# Patient Record
Sex: Female | Born: 1965 | Race: White | Hispanic: No | Marital: Married | Smoking: Never smoker
Health system: Southern US, Community
[De-identification: ages and names within clinical notes are randomized; demographics above are authoritative.]

## PROBLEM LIST (undated history)

## (undated) DIAGNOSIS — E785 Hyperlipidemia, unspecified: Secondary | ICD-10-CM

## (undated) DIAGNOSIS — J309 Allergic rhinitis, unspecified: Secondary | ICD-10-CM

## (undated) DIAGNOSIS — G4733 Obstructive sleep apnea (adult) (pediatric): Secondary | ICD-10-CM

## (undated) DIAGNOSIS — D649 Anemia, unspecified: Secondary | ICD-10-CM

## (undated) DIAGNOSIS — E669 Obesity, unspecified: Secondary | ICD-10-CM

## (undated) DIAGNOSIS — E559 Vitamin D deficiency, unspecified: Secondary | ICD-10-CM

## (undated) DIAGNOSIS — M1990 Unspecified osteoarthritis, unspecified site: Secondary | ICD-10-CM

## (undated) DIAGNOSIS — E539 Vitamin B deficiency, unspecified: Secondary | ICD-10-CM

## (undated) DIAGNOSIS — J45909 Unspecified asthma, uncomplicated: Secondary | ICD-10-CM

## (undated) DIAGNOSIS — R002 Palpitations: Secondary | ICD-10-CM

## (undated) HISTORY — DX: Vitamin B deficiency, unspecified: E53.9

## (undated) HISTORY — DX: Allergic rhinitis, unspecified: J30.9

## (undated) HISTORY — DX: Hyperlipidemia, unspecified: E78.5

## (undated) HISTORY — PX: OTHER SURGICAL HISTORY: SHX169

## (undated) HISTORY — DX: Unspecified osteoarthritis, unspecified site: M19.90

## (undated) HISTORY — DX: Palpitations: R00.2

## (undated) HISTORY — PX: TUBAL LIGATION: SHX77

## (undated) HISTORY — DX: Vitamin D deficiency, unspecified: E55.9

## (undated) HISTORY — DX: Anemia, unspecified: D64.9

## (undated) HISTORY — DX: Unspecified asthma, uncomplicated: J45.909

## (undated) HISTORY — DX: Obstructive sleep apnea (adult) (pediatric): G47.33

## (undated) HISTORY — DX: Obesity, unspecified: E66.9

---

## 2008-02-07 ENCOUNTER — Ambulatory Visit: Payer: Self-pay | Admitting: Internal Medicine

## 2008-02-21 ENCOUNTER — Ambulatory Visit: Payer: Self-pay | Admitting: Internal Medicine

## 2009-03-15 ENCOUNTER — Emergency Department: Payer: Self-pay | Admitting: Emergency Medicine

## 2009-09-23 ENCOUNTER — Ambulatory Visit: Payer: Self-pay | Admitting: Internal Medicine

## 2010-11-26 ENCOUNTER — Ambulatory Visit: Payer: Self-pay | Admitting: Internal Medicine

## 2011-10-17 ENCOUNTER — Ambulatory Visit: Payer: Self-pay | Admitting: Internal Medicine

## 2012-01-06 ENCOUNTER — Ambulatory Visit: Payer: Self-pay | Admitting: Internal Medicine

## 2012-02-21 ENCOUNTER — Ambulatory Visit: Payer: Self-pay | Admitting: Internal Medicine

## 2013-01-07 ENCOUNTER — Ambulatory Visit: Payer: Self-pay | Admitting: Internal Medicine

## 2014-01-08 ENCOUNTER — Ambulatory Visit: Payer: Self-pay | Admitting: Nurse Practitioner

## 2016-09-01 ENCOUNTER — Other Ambulatory Visit: Payer: Self-pay | Admitting: Nurse Practitioner

## 2016-09-01 DIAGNOSIS — Z1231 Encounter for screening mammogram for malignant neoplasm of breast: Secondary | ICD-10-CM

## 2016-09-26 ENCOUNTER — Ambulatory Visit
Admission: RE | Admit: 2016-09-26 | Discharge: 2016-09-26 | Disposition: A | Discharge status: Home / Self Care | Payer: 59 | Source: Ambulatory Visit | Attending: Nurse Practitioner | Admitting: Nurse Practitioner

## 2016-09-26 ENCOUNTER — Encounter: Payer: Self-pay | Admitting: Radiology

## 2016-09-26 DIAGNOSIS — Z1231 Encounter for screening mammogram for malignant neoplasm of breast: Secondary | ICD-10-CM | POA: Diagnosis not present

## 2017-06-08 ENCOUNTER — Other Ambulatory Visit: Payer: Self-pay | Admitting: Nurse Practitioner

## 2017-06-08 DIAGNOSIS — Z1231 Encounter for screening mammogram for malignant neoplasm of breast: Secondary | ICD-10-CM

## 2017-10-06 ENCOUNTER — Ambulatory Visit
Admission: RE | Admit: 2017-10-06 | Discharge: 2017-10-06 | Disposition: A | Discharge status: Home / Self Care | Payer: Managed Care, Other (non HMO) | Source: Ambulatory Visit | Attending: Nurse Practitioner | Admitting: Nurse Practitioner

## 2017-10-06 DIAGNOSIS — Z1231 Encounter for screening mammogram for malignant neoplasm of breast: Secondary | ICD-10-CM | POA: Diagnosis not present

## 2018-06-15 ENCOUNTER — Other Ambulatory Visit: Payer: Self-pay | Admitting: Nurse Practitioner

## 2018-06-15 DIAGNOSIS — Z1231 Encounter for screening mammogram for malignant neoplasm of breast: Secondary | ICD-10-CM

## 2018-10-15 ENCOUNTER — Inpatient Hospital Stay: Admission: RE | Admit: 2018-10-15 | Payer: Managed Care, Other (non HMO) | Source: Ambulatory Visit

## 2019-01-28 ENCOUNTER — Ambulatory Visit
Admission: RE | Admit: 2019-01-28 | Discharge: 2019-01-28 | Disposition: A | Discharge status: Home / Self Care | Payer: 59 | Source: Ambulatory Visit | Attending: Nurse Practitioner | Admitting: Nurse Practitioner

## 2019-01-28 DIAGNOSIS — Z1231 Encounter for screening mammogram for malignant neoplasm of breast: Secondary | ICD-10-CM | POA: Insufficient documentation

## 2019-07-05 ENCOUNTER — Other Ambulatory Visit: Payer: Self-pay

## 2019-07-05 ENCOUNTER — Ambulatory Visit: Payer: 59 | Attending: Internal Medicine

## 2019-07-05 DIAGNOSIS — Z23 Encounter for immunization: Secondary | ICD-10-CM

## 2019-07-05 NOTE — Progress Notes (Signed)
   Covid-19 Vaccination Clinic  Name:  Connie Howe    MRN: [id number] DOB: 1965-10-07  07/05/2019  [name] was observed post Covid-19 immunization for 15 minutes without incident. She was provided with Vaccine Information Sheet and instruction to access the V-Safe system.   [name] was instructed to call 911 with any severe reactions post vaccine: Marland Kitchen Difficulty breathing  . Swelling of face and throat  . A fast heartbeat  . A bad rash all over body  . Dizziness and weakness   Immunizations Administered    Name Date Dose VIS Date Route   Pfizer COVID-19 Vaccine 07/05/2019  9:03 AM 0.3 mL 03/29/2019 Intramuscular   Manufacturer: [employer], [employer]   Lot: [id number]   NDC: [address]-8

## 2019-07-30 ENCOUNTER — Ambulatory Visit: Payer: Managed Care, Other (non HMO) | Attending: Internal Medicine

## 2019-07-30 DIAGNOSIS — Z23 Encounter for immunization: Secondary | ICD-10-CM

## 2019-07-30 NOTE — Progress Notes (Signed)
   Covid-19 Vaccination Clinic  Name:  Connie Howe    MRN: [id number] DOB: June 23, 1965  07/30/2019  [name] was observed post Covid-19 immunization for 15 minutes without incident. She was provided with Vaccine Information Sheet and instruction to access the V-Safe system.   [name] was instructed to call 911 with any severe reactions post vaccine: Marland Kitchen Difficulty breathing  . Swelling of face and throat  . A fast heartbeat  . A bad rash all over body  . Dizziness and weakness   Immunizations Administered    Name Date Dose VIS Date Route   Pfizer COVID-19 Vaccine 07/30/2019 10:15 AM 0.3 mL 03/29/2019 Intramuscular   Manufacturer: [employer], [employer]   Lot: [id number]   NDC: [address]-0

## 2020-07-15 ENCOUNTER — Other Ambulatory Visit: Payer: Self-pay | Admitting: Nurse Practitioner

## 2020-07-15 DIAGNOSIS — Z1231 Encounter for screening mammogram for malignant neoplasm of breast: Secondary | ICD-10-CM

## 2020-08-04 ENCOUNTER — Ambulatory Visit
Admission: RE | Admit: 2020-08-04 | Discharge: 2020-08-04 | Disposition: A | Discharge status: Home / Self Care | Payer: 59 | Source: Ambulatory Visit | Attending: Nurse Practitioner | Admitting: Nurse Practitioner

## 2020-08-04 ENCOUNTER — Other Ambulatory Visit: Payer: Self-pay

## 2020-08-04 DIAGNOSIS — Z1231 Encounter for screening mammogram for malignant neoplasm of breast: Secondary | ICD-10-CM | POA: Diagnosis present

## 2022-06-13 ENCOUNTER — Encounter: Payer: Self-pay | Admitting: Gastroenterology

## 2022-06-20 ENCOUNTER — Encounter: Payer: Self-pay | Admitting: Nurse Practitioner

## 2022-06-20 DIAGNOSIS — A0472 Enterocolitis due to Clostridium difficile, not specified as recurrent: Secondary | ICD-10-CM

## 2022-06-21 MED ORDER — VANCOMYCIN HCL 250 MG PO CAPS: 250.0000 mg | ORAL_CAPSULE | Freq: Four times a day (QID) | ORAL | 0 refills | Status: DC

## 2022-06-24 ENCOUNTER — Other Ambulatory Visit: Payer: Self-pay | Admitting: Nurse Practitioner

## 2022-07-01 ENCOUNTER — Encounter: Payer: Self-pay | Admitting: Gastroenterology

## 2022-07-01 ENCOUNTER — Ambulatory Visit: Payer: 59 | Admitting: Gastroenterology

## 2022-07-01 ENCOUNTER — Telehealth: Payer: Self-pay

## 2022-07-01 VITALS — BP 120/88 | HR 65 | Ht 65.0 in | Wt 214.0 lb

## 2022-07-01 DIAGNOSIS — R194 Change in bowel habit: Secondary | ICD-10-CM | POA: Diagnosis not present

## 2022-07-01 MED ORDER — NA SULFATE-K SULFATE-MG SULF 17.5-3.13-1.6 GM/177ML PO SOLN: 1.0000 | ORAL | 0 refills | Status: DC

## 2022-07-01 MED ORDER — RIFAXIMIN 550 MG PO TABS: 550.0000 mg | ORAL_TABLET | Freq: Three times a day (TID) | ORAL | 0 refills | Status: DC

## 2022-07-01 NOTE — Telephone Encounter (Signed)
PA request received via CMM for Xifaxan 550MG  tablets  PA submitted to [facility] and is pending determination  Key: [id number]

## 2022-07-01 NOTE — Patient Instructions (Signed)
It was my pleasure to provide care to you today. Based on our discussion, I am providing you with my recommendations below:  RECOMMENDATION(S): You have been scheduled for a colonoscopy. Please follow written instructions given to you at your visit today.  Please pick up your prep supplies at the pharmacy within the next 1-3 days. If you use inhalers (even only as needed), please bring them with you on the day of your procedure.  We have sent the following medications to your pharmacy for you to pick up at your convenience: [place] [facility]:  After your procedure, you will receive a call from my office staff regarding my recommendation for follow up.  BMI:  _______________________________________________________  If your blood pressure at your visit was 140/90 or greater, please contact your primary care physician to follow up on this.  _______________________________________________________  If you are age 42 or older, your body mass index should be between 23-30. Your Body mass index is 35.61 kg/m. If this is out of the aforementioned range listed, please consider follow up with your Primary Care Provider.  If you are age 32 or younger, your body mass index should be between 19-25. Your Body mass index is 35.61 kg/m. If this is out of the aformentioned range listed, please consider follow up with your Primary Care Provider.    MY CHART:  The [place] [facility] providers would like to encourage you to use [facility] to communicate with providers for non-urgent requests or questions.  Due to long hold times on the telephone, sending your provider a message by [facility] may be a faster and more efficient way to get a response.  Please allow 48 business hours for a response.  Please remember that this is for non-urgent requests.   Thank you for trusting me with your gastrointestinal care!    Thornton Park, MD, MPH

## 2022-07-01 NOTE — Progress Notes (Signed)
Referring Provider: Evern Bio, NP Primary Care Physician:  Evern Bio, NP  Reason for Consultation:  Change in bowel habits   IMPRESSION:  Change in bowel habits - Symptoms started after C diff now with altered stool and mucous in the stool.  ?post-infectious IBS, must exclude organic disease such as IBD  PLAN: Colonoscopy with biopsies Empiric trial of Xifaxan 550 mg TID x 14 days Additional stools studies if colonoscopy is non-diagnostic   HPI: Connie Howe is a 57 y.o. female referred by NP [facility] for further evaluation of abdominal bloating, nausea, and mucus/bloody diarrhea.  The history is obtained through the patient, review of paper records provided by NP Charlynn Grimes, and review of her electronic health record.  She has a history of allergic rhinitis, asthma, hyperlipidemia, obstructive sleep apnea, obesity, palpitations controlled with low-dose metoprolol, vitamin D deficiency, vitamin D deficiency, and social phobia. She is a Librarian, academic.  She has had several weeks of abdominal bloating, nausea, and bloody diarrhea not responding to Imodium after having C diff in November 2023. Antibiotics for ear infection and pleurisy preceded the C Diff. She completed 14 days of oral vancomycin and Florastor. She has continued on the Florastor due to intermittent diarrhea and mushy, poorly formed stools.  She recently had an accident where she did not make it to the bathroom with all mucous output.   Normal bowel habits are one formed stool daily. No extra-GI manifestations of IBD except for chronic dry eyes.   The patient was adopted.  No known family history of colon cancer, colon polyps, or gastrointestinal disease.  No recent abdominal imaging.  Cologuard over 3 years ago. No prior endoscopic evaluation.  Labs 07/15/2020 include a normal CBC and normal CMP.  At that time TSH and hemoglobin A1c were also normal.   Past Medical History:  Diagnosis Date    Allergic rhinitis    Anemia    Asthma    Hyperlipidemia    Obesity    Palpitations    Sleep apnea, obstructive    Vitamin B deficiency    Vitamin D deficiency     Past Surgical History:  Procedure Laterality Date   TUBAL LIGATION     uterine ablation      Current Outpatient Medications  Medication Sig Dispense Refill   albuterol (VENTOLIN HFA) 108 (90 Base) MCG/ACT inhaler Inhale 2 puffs into the lungs every 4 (four) hours as needed for shortness of breath.     budesonide-formoterol (SYMBICORT) 160-4.5 MCG/ACT inhaler Inhale 2 puffs into the lungs 2 (two) times daily.     escitalopram (LEXAPRO) 5 MG tablet Take 5 mg by mouth daily.     hydrOXYzine (ATARAX) 10 MG tablet Take 10 mg by mouth daily.     metoprolol succinate (TOPROL-XL) 50 MG 24 hr tablet Take 100 mg by mouth daily.     montelukast (SINGULAIR) 10 MG tablet TAKE 1 TABLET BY MOUTH IN THE  MORNING 90 tablet 3   Na Sulfate-K Sulfate-Mg Sulf (SUPREP BOWEL PREP KIT) 17.5-3.13-1.6 GM/177ML SOLN Take 1 kit by mouth as directed. For colonoscopy prep 354 mL 0   rifaximin (XIFAXAN) 550 MG TABS tablet Take 1 tablet (550 mg total) by mouth 3 (three) times daily. 42 tablet 0   rosuvastatin (CRESTOR) 5 MG tablet Take 5 mg by mouth daily.     No current facility-administered medications for this visit.    Allergies as of 07/01/2022 - Review Complete 07/01/2022  Allergen Reaction Noted  Dog epithelium Dermatitis, Itching, Rash, Shortness Of Breath, and Swelling 06/27/2022    Family History  Problem Relation Age of Onset   Breast cancer Cousin        maternal    Social History   Socioeconomic History   Marital status: Married    Spouse name: Not on file   Number of children: Not on file   Years of education: Not on file   Highest education level: Not on file  Occupational History   Not on file  Tobacco Use   Smoking status: Not on file   Smokeless tobacco: Not on file  Substance and Sexual Activity   Alcohol use:  Not on file   Drug use: Not on file   Sexual activity: Not on file  Other Topics Concern   Not on file  Social History Narrative   Not on file   Social Determinants of Health   Financial Resource Strain: Not on file  Food Insecurity: Not on file  Transportation Needs: Not on file  Physical Activity: Not on file  Stress: Not on file  Social Connections: Not on file  Intimate Partner Violence: Not on file    Review of Systems: 12 system ROS is negative except as noted above except for intermittent ankle edema.   Physical Exam: General:   Alert,  well-nourished, pleasant and cooperative in NAD Head:  Normocephalic and atraumatic. Eyes:  Sclera clear, no icterus.   Conjunctiva pink. Ears:  Normal auditory acuity. Nose:  No deformity, discharge,  or lesions. Mouth:  No deformity or lesions.   Neck:  Supple; no masses or thyromegaly. Lungs:  Clear throughout to auscultation.   No wheezes. Heart:  Regular rate and rhythm; no murmurs. Abdomen:  Soft, central obesity, nontender, nondistended, normal bowel sounds, no rebound or guarding. No hepatosplenomegaly.   Rectal:  Deferred  Msk:  Symmetrical. No boney deformities LAD: No inguinal or umbilical LAD Extremities:  No clubbing or edema. Neurologic:  Alert and  oriented x4;  grossly nonfocal Skin:  Intact without significant lesions or rashes. Psych:  Alert and cooperative. Normal mood and affect.    Tariya Morrissette L. Tarri Glenn, MD, MPH 07/01/2022, 12:13 PM

## 2022-07-04 ENCOUNTER — Encounter: Payer: Self-pay | Admitting: Gastroenterology

## 2022-07-04 NOTE — Telephone Encounter (Signed)
Appeal submitted via [place]. Pending determination  Key: [id number]

## 2022-07-07 NOTE — Telephone Encounter (Signed)
Patient Advocate Encounter  Prior Authorization for Connie Howe has been approved.    PA# Appeal Case [id number] Effective dates: 3.15.24 through 4.2.24   However, fax received requesting additional information. Filled out form and faxed to number provided [phone]

## 2022-07-18 NOTE — Telephone Encounter (Signed)
Appeal has been APPROVED. Medication is now covered from 07/01/2022-07/19/2022

## 2022-07-26 ENCOUNTER — Encounter: Payer: Self-pay | Admitting: Gastroenterology

## 2022-08-04 ENCOUNTER — Ambulatory Visit (AMBULATORY_SURGERY_CENTER): Payer: 59 | Admitting: Gastroenterology

## 2022-08-04 ENCOUNTER — Encounter: Payer: Self-pay | Admitting: Gastroenterology

## 2022-08-04 VITALS — BP 112/62 | HR 61 | Temp 97.8°F | Resp 16 | Ht 65.0 in | Wt 214.0 lb

## 2022-08-04 DIAGNOSIS — R194 Change in bowel habit: Secondary | ICD-10-CM

## 2022-08-04 DIAGNOSIS — D123 Benign neoplasm of transverse colon: Secondary | ICD-10-CM | POA: Diagnosis present

## 2022-08-04 DIAGNOSIS — K635 Polyp of colon: Secondary | ICD-10-CM | POA: Diagnosis not present

## 2022-08-04 MED ORDER — SODIUM CHLORIDE 0.9 % IV SOLN: 500.0000 mL | Freq: Once | INTRAVENOUS | Status: DC

## 2022-08-04 NOTE — Progress Notes (Signed)
Called to room to assist during endoscopic procedure.  Patient ID and intended procedure confirmed with present staff. Received instructions for my participation in the procedure from the performing physician.  

## 2022-08-04 NOTE — Progress Notes (Signed)
Uneventful anesthetic. Report to pacu rn. Vss. Care resumed by rn. 

## 2022-08-04 NOTE — Op Note (Signed)
[facility] Patient Name: Connie Howe Procedure Date: 08/04/2022 11:53 AM MRN: [id number] Endoscopist: Tressia Danas MD, MD, [id number] Age: 57 Referring MD:  Date of Birth: June 30, 1965 Gender: Female Account #: [id number] Procedure:                Colonoscopy Indications:              Change in bowel habits Medicines:                Monitored Anesthesia Care Procedure:                Pre-Anesthesia Assessment:                           - Prior to the procedure, a History and Physical                            was performed, and patient medications and                            allergies were reviewed. The patient's tolerance of                            previous anesthesia was also reviewed. The risks                            and benefits of the procedure and the sedation                            options and risks were discussed with the patient.                            All questions were answered, and informed consent                            was obtained. Prior Anticoagulants: The patient has                            taken no anticoagulant or antiplatelet agents. ASA                            Grade Assessment: II - A patient with mild systemic                            disease. After reviewing the risks and benefits,                            the patient was deemed in satisfactory condition to                            undergo the procedure.                           After obtaining informed consent, the colonoscope  was passed under direct vision. Throughout the                            procedure, the patient's blood pressure, pulse, and                            oxygen saturations were monitored continuously. The                            CF [id number] #[id number] was introduced through the anus                            and advanced to the 3 cm into the ileum. The                            colonoscopy was performed  without difficulty. The                            patient tolerated the procedure well. The quality                            of the bowel preparation was good. The terminal                            ileum, ileocecal valve, appendiceal orifice, and                            rectum were photographed. Scope In: 11:57:07 AM Scope Out: 12:10:22 PM Scope Withdrawal Time: 0 hours 9 minutes 14 seconds  Total Procedure Duration: 0 hours 13 minutes 15 seconds  Findings:                 The perianal and digital rectal examinations were                            normal.                           A 5 mm polyp was found in the transverse colon. The                            polyp was flat. The polyp was removed with a cold                            snare. Resection and retrieval were complete.                            Estimated blood loss was minimal.                           A few medium-mouthed and small-mouthed diverticula                            were found in the sigmoid colon and descending  colon.                           The colon was otherwise normal. Biopsies were taken                            from the right and left colon with a cold forceps                            for histology. Estimated blood loss was minimal.                           The exam was otherwise without abnormality on                            direct and retroflexion views. Complications:            No immediate complications. Estimated Blood Loss:     Estimated blood loss was minimal. Impression:               - One 5 mm polyp in the transverse colon, removed                            with a cold snare. Resected and retrieved.                           - The entire examined colon is normal. Biopsied.                           - The examination was otherwise normal on direct                            and retroflexion views. Recommendation:           - Patient has a contact  number available for                            emergencies. The signs and symptoms of potential                            delayed complications were discussed with the                            patient. Return to normal activities tomorrow.                            Written discharge instructions were provided to the                            patient.                           - Resume previous diet.                           - Continue present medications.                           -  Await pathology results.                           - Repeat colonoscopy date to be determined after                            pending pathology results are reviewed for                            surveillance.                           - Emerging evidence supports eating a diet of                            fruits, vegetables, grains, calcium, and yogurt                            while reducing red meat and alcohol may reduce the                            risk of colon cancer.                           - Office follow-up. Tressia Danas MD, MD 08/04/2022 12:16:03 PM This report has been signed electronically.

## 2022-08-04 NOTE — Patient Instructions (Addendum)
Continue present medications. Await pathology results. Repeat colonoscopy date to be determined after pending pathology results are reviewed for surveillance. Emerging evidence supports eating a diet of fruits, vegetables, grains, calcium, and yogurt while reducing red meat and alcohol may reduce the  risk of colon cancer.                                                   Office follow-up.   YOU HAD AN ENDOSCOPIC PROCEDURE TODAY AT THE [facility]:   Refer to the procedure report that was given to you for any specific questions about what was found during the examination.  If the procedure report does not answer your questions, please call your gastroenterologist to clarify.  If you requested that your care partner not be given the details of your procedure findings, then the procedure report has been included in a sealed envelope for you to review at your convenience later.  YOU SHOULD EXPECT: Some feelings of bloating in the abdomen. Passage of more gas than usual.  Walking can help get rid of the air that was put into your GI tract during the procedure and reduce the bloating. If you had a lower endoscopy (such as a colonoscopy or flexible sigmoidoscopy) you may notice spotting of blood in your stool or on the toilet paper. If you underwent a bowel prep for your procedure, you may not have a normal bowel movement for a few days.  Please Note:  You might notice some irritation and congestion in your nose or some drainage.  This is from the oxygen used during your procedure.  There is no need for concern and it should clear up in a day or so.  SYMPTOMS TO REPORT IMMEDIATELY:  Following lower endoscopy (colonoscopy or flexible sigmoidoscopy):  Excessive amounts of blood in the stool  Significant tenderness or worsening of abdominal pains  Swelling of the abdomen that is new, acute  Fever of 100F or higher  For urgent or emergent issues, a gastroenterologist can be reached at any  hour by calling [phone]. Do not use MyChart messaging for urgent concerns.    DIET:  We do recommend a small meal at first, but then you may proceed to your regular diet.  Drink plenty of fluids but you should avoid alcoholic beverages for 24 hours.  ACTIVITY:  You should plan to take it easy for the rest of today and you should NOT DRIVE or use heavy machinery until tomorrow (because of the sedation medicines used during the test).    FOLLOW UP: Our staff will call the number listed on your records the next business day following your procedure.  We will call around 7:15- 8:00 am to check on you and address any questions or concerns that you may have regarding the information given to you following your procedure. If we do not reach you, we will leave a message.     If any biopsies were taken you will be contacted by phone or by letter within the next 1-3 weeks.  Please call us at [phone] if you have not heard about the biopsies in 3 weeks.    SIGNATURES/CONFIDENTIALITY: You and/or your care partner have signed paperwork which will be entered into your electronic medical record.  These signatures attest to the fact that that the information above on  your After Visit Summary has been reviewed and is understood.  Full responsibility of the confidentiality of this discharge information lies with you and/or your care-partner.

## 2022-08-04 NOTE — Progress Notes (Signed)
Referring Provider: Orson Eva, NP Primary Care Physician:  Orson Eva, NP  Indication for Colonoscopy:  Change in bowel habits   IMPRESSION:  Change in bowel habits Appropriate candidate for monitored anesthesia care  PLAN: Colonoscopy in the [facility] today   HPI: Connie Howe is a 57 y.o. female presents for screening colonoscopy.  She has had several weeks of abdominal bloating, nausea, and bloody diarrhea not responding to Imodium after having C diff in November 2023. Antibiotics for ear infection and pleurisy preceded the C Diff. She completed 14 days of oral vancomycin and Florastor. She has continued on the Florastor due to intermittent diarrhea and mushy, poorly formed stools.  She recently had an accident where she did not make it to the bathroom with all mucous output.    Normal bowel habits are one formed stool daily. No extra-GI manifestations of IBD except for chronic dry eyes.    The patient was adopted.  No known family history of colon cancer, colon polyps, or gastrointestinal disease.   No recent abdominal imaging.   Cologuard over 3 years ago. No prior endoscopic evaluation.   Labs 07/15/2020 include a normal CBC and normal CMP.  At that time TSH and hemoglobin A1c were also normal.   Past Medical History:  Diagnosis Date   Allergic rhinitis    Anemia    Asthma    Hyperlipidemia    Obesity    Palpitations    Sleep apnea, obstructive    Vitamin B deficiency    Vitamin D deficiency     Past Surgical History:  Procedure Laterality Date   TUBAL LIGATION     uterine ablation      Current Outpatient Medications  Medication Sig Dispense Refill   budesonide-formoterol (SYMBICORT) 160-4.5 MCG/ACT inhaler Inhale 2 puffs into the lungs 2 (two) times daily.     Cholecalciferol (VITAMIN D3) 1.25 MG (50000 UT) CAPS      escitalopram (LEXAPRO) 5 MG tablet Take 5 mg by mouth daily.     metoprolol succinate (TOPROL-XL) 50 MG 24 hr tablet Take  100 mg by mouth daily.     montelukast (SINGULAIR) 10 MG tablet TAKE 1 TABLET BY MOUTH IN THE  MORNING 90 tablet 3   OVER THE COUNTER MEDICATION Take 1 tablet by mouth daily.     rosuvastatin (CRESTOR) 5 MG tablet Take 5 mg by mouth daily.     albuterol (VENTOLIN HFA) 108 (90 Base) MCG/ACT inhaler Inhale 2 puffs into the lungs every 4 (four) hours as needed for shortness of breath.     hydrOXYzine (ATARAX) 10 MG tablet Take 10 mg by mouth daily. (Patient not taking: Reported on 08/04/2022)     rifaximin (XIFAXAN) 550 MG TABS tablet Take 1 tablet (550 mg total) by mouth 3 (three) times daily. (Patient not taking: Reported on 08/04/2022) 42 tablet 0   Current Facility-Administered Medications  Medication Dose Route Frequency Provider Last Rate Last Admin   0.9 %  sodium chloride infusion  500 mL Intravenous Once Tressia Danas, MD        Allergies as of 08/04/2022 - Review Complete 08/04/2022  Allergen Reaction Noted   Dog epithelium Dermatitis, Itching, Rash, Shortness Of Breath, and Swelling 06/27/2022   Other Shortness Of Breath, Itching, and Rash 08/04/2022   Grass pollen(k-o-r-t-swt vern) Itching and Rash 08/04/2022    Family History  Problem Relation Age of Onset   Breast cancer Cousin        maternal  Physical Exam: General:   Alert,  well-nourished, pleasant and cooperative in NAD Head:  Normocephalic and atraumatic. Eyes:  Sclera clear, no icterus.   Conjunctiva pink. Mouth:  No deformity or lesions.   Neck:  Supple; no masses or thyromegaly. Lungs:  Clear throughout to auscultation.   No wheezes. Heart:  Regular rate and rhythm; no murmurs. Abdomen:  Soft, non-tender, nondistended, normal bowel sounds, no rebound or guarding.  Msk:  Symmetrical. No boney deformities LAD: No inguinal or umbilical LAD Extremities:  No clubbing or edema. Neurologic:  Alert and  oriented x4;  grossly nonfocal Skin:  No obvious rash or bruise. Psych:  Alert and cooperative. Normal mood  and affect.     Studies/Results: No results found.    Ernestyne Caldwell L. Orvan Falconer, MD, MPH 08/04/2022, 11:47 AM

## 2022-08-05 ENCOUNTER — Telehealth: Payer: Self-pay | Admitting: *Deleted

## 2022-08-05 NOTE — Telephone Encounter (Signed)
-----   Message from Tressia Danas, MD sent at 08/04/2022 12:12 PM EDT ----- Office follow-up with an APP.  Thanks.  KLB

## 2022-08-05 NOTE — Telephone Encounter (Signed)
Appointment scheduled with Dr. Barron Alvine on 10/25/22 @ 9 am.

## 2022-08-05 NOTE — Telephone Encounter (Signed)
Attempted f/u phone call. No answer. Left message. °

## 2022-08-09 ENCOUNTER — Encounter: Payer: Self-pay | Admitting: Gastroenterology

## 2022-08-14 ENCOUNTER — Other Ambulatory Visit: Payer: Self-pay | Admitting: Nurse Practitioner

## 2022-09-19 ENCOUNTER — Ambulatory Visit: Payer: 59 | Admitting: Nurse Practitioner

## 2022-09-22 ENCOUNTER — Ambulatory Visit: Payer: 59 | Admitting: Internal Medicine

## 2022-09-22 ENCOUNTER — Encounter: Payer: Self-pay | Admitting: Internal Medicine

## 2022-09-22 VITALS — BP 118/74 | HR 68 | Ht 65.0 in | Wt 220.0 lb

## 2022-09-22 DIAGNOSIS — R7303 Prediabetes: Secondary | ICD-10-CM

## 2022-09-22 DIAGNOSIS — E559 Vitamin D deficiency, unspecified: Secondary | ICD-10-CM

## 2022-09-22 DIAGNOSIS — E538 Deficiency of other specified B group vitamins: Secondary | ICD-10-CM

## 2022-09-22 DIAGNOSIS — R600 Localized edema: Secondary | ICD-10-CM | POA: Diagnosis not present

## 2022-09-22 DIAGNOSIS — R5383 Other fatigue: Secondary | ICD-10-CM | POA: Diagnosis not present

## 2022-09-22 DIAGNOSIS — E782 Mixed hyperlipidemia: Secondary | ICD-10-CM

## 2022-09-22 MED ORDER — FUROSEMIDE 20 MG PO TABS
20.0000 mg | ORAL_TABLET | Freq: Every day | ORAL | 0 refills | Status: DC
Start: 2022-09-22 — End: 2022-10-14

## 2022-09-22 NOTE — Progress Notes (Signed)
Established Patient Office Visit  Subjective:  Patient ID: Connie Howe, female    DOB: 12-Mar-1966  Age: 57 y.o. MRN: [id number]  Chief Complaint  Patient presents with   Follow-up    Headaches/edema in ankles    Patient comes in today with reports of headaches, nausea and now has bilateral leg swelling.  Patient recently had several GI problems including C. difficile infection.  She has been having change in her bowel habits and is being treated with different medications and probiotics.   Patient reports she started having headaches last week and she felt dehydrated and salt depleted so decided to increase her salt intake as she was already drinking plenty of fluids.  Today her headaches have resolved but she can now has bilateral leg swelling..  She does not have any shortness of breath, no chest pain, no palpitations.  She does feel very tired.    No other concerns at this time.   Past Medical History:  Diagnosis Date   Allergic rhinitis    Anemia    Asthma    Hyperlipidemia    Obesity    Palpitations    Sleep apnea, obstructive    Vitamin B deficiency    Vitamin D deficiency     Past Surgical History:  Procedure Laterality Date   TUBAL LIGATION     uterine ablation      Social History   Socioeconomic History   Marital status: Married    Spouse name: Not on file   Number of children: Not on file   Years of education: Not on file   Highest education level: Not on file  Occupational History   Not on file  Tobacco Use   Smoking status: Not on file    Passive exposure: Past   Smokeless tobacco: Not on file  Substance and Sexual Activity   Alcohol use: Never   Drug use: Never   Sexual activity: Not on file  Other Topics Concern   Not on file  Social History Narrative   Not on file   Social Determinants of Health   Financial Resource Strain: Not on file  Food Insecurity: Not on file  Transportation Needs: Not on file  Physical Activity: Not  on file  Stress: Not on file  Social Connections: Not on file  Intimate Partner Violence: Not on file    Family History  Problem Relation Age of Onset   Breast cancer Cousin        maternal    Allergies  Allergen Reactions   Dog Epithelium Dermatitis, Itching, Rash, Shortness Of Breath and Swelling   Other Shortness Of Breath, Itching and Rash    Cat epithelium,, trees, weed pollen   Grass Pollen(K-O-R-T-Swt Vern) Itching and Rash    Review of Systems  Constitutional:  Positive for malaise/fatigue. Negative for chills, diaphoresis, fever and weight loss.  HENT: Negative.    Eyes: Negative.   Respiratory:  Negative for cough, hemoptysis, sputum production, shortness of breath and wheezing.   Cardiovascular:  Negative for chest pain, palpitations, leg swelling and PND.  Gastrointestinal:  Positive for nausea. Negative for abdominal pain, blood in stool, diarrhea, heartburn and melena.  Genitourinary:  Negative for dysuria, flank pain, frequency, hematuria and urgency.  Musculoskeletal:  Negative for back pain, falls, joint pain, myalgias and neck pain.  Skin: Negative.   Neurological:  Positive for dizziness and headaches. Negative for tremors, sensory change, speech change, focal weakness, seizures, loss of consciousness and weakness.  Psychiatric/Behavioral:  Negative for depression. The patient is not nervous/anxious and does not have insomnia.        Objective:   BP 118/74   Pulse 68   Ht 5\' 5"  (1.651 m)   Wt 220 lb (99.8 kg)   SpO2 96%   BMI 36.61 kg/m   Vitals:   09/22/22 1118  BP: 118/74  Pulse: 68  Height: 5\' 5"  (1.651 m)  Weight: 220 lb (99.8 kg)  SpO2: 96%  BMI (Calculated): 36.61    Physical Exam Vitals and nursing note reviewed.  Constitutional:      Appearance: Normal appearance.  Cardiovascular:     Rate and Rhythm: Normal rate and regular rhythm.     Pulses: Normal pulses.     Heart sounds: Normal heart sounds.  Pulmonary:     Effort:  Pulmonary effort is normal.     Breath sounds: Normal breath sounds.  Abdominal:     General: Bowel sounds are normal. There is no distension.     Palpations: Abdomen is soft. There is no mass.     Tenderness: There is no right CVA tenderness or left CVA tenderness.  Musculoskeletal:        General: No tenderness.     Cervical back: Normal range of motion and neck supple.     Right lower leg: Edema present.     Left lower leg: Edema present.  Skin:    General: Skin is warm and dry.  Neurological:     General: No focal deficit present.     Mental Status: She is alert and oriented to person, place, and time.  Psychiatric:        Mood and Affect: Mood normal.        Behavior: Behavior normal.      No results found for any visits on 09/22/22.  No results found for this or any previous visit (from the past 2160 hour(s)).    Assessment & Plan:  Patient to get blood work done today.  Advised to sit with her legs elevated. Prescription sent for furosemide 20 mg that she is supposed to take only on Monday Wednesday and Friday. Will discuss her lab results once available. Problem List Items Addressed This Visit     Lower leg edema - Primary   Relevant Medications   furosemide (LASIX) 20 MG tablet   Other Relevant Orders   CMP14+EGFR   Vitamin D deficiency   Relevant Orders   Vitamin D (25 hydroxy)   Vitamin B12 deficiency   Relevant Orders   Vitamin B12   Other fatigue   Relevant Orders   CBC With Differential   TSH+T4F+T3Free   Prediabetes   Relevant Orders   Hemoglobin A1c   Mixed hyperlipidemia   Relevant Medications   furosemide (LASIX) 20 MG tablet   Other Relevant Orders   CMP14+EGFR   Lipid Panel w/o Chol/HDL Ratio    Return in about 2 weeks (around 10/06/2022).   Total time spent: 30 minutes  Margaretann Loveless, MD  09/22/2022   This document may have been prepared by [facility] Voice Recognition software and as such may include unintentional dictation errors.

## 2022-09-23 LAB — HEMOGLOBIN A1C
Est. average glucose Bld gHb Est-mCnc: 111 mg/dL
Hgb A1c MFr Bld: 5.5 % (ref 4.8–5.6)

## 2022-09-23 LAB — CBC WITH DIFFERENTIAL
Basophils Absolute: 0 10*3/uL (ref 0.0–0.2)
Basos: 1 %
EOS (ABSOLUTE): 0.1 10*3/uL (ref 0.0–0.4)
Eos: 2 %
Hematocrit: 41.7 % (ref 34.0–46.6)
Hemoglobin: 13.8 g/dL (ref 11.1–15.9)
Immature Grans (Abs): 0 10*3/uL (ref 0.0–0.1)
Immature Granulocytes: 1 %
Lymphocytes Absolute: 1.8 10*3/uL (ref 0.7–3.1)
Lymphs: 28 %
MCH: 30.6 pg (ref 26.6–33.0)
MCHC: 33.1 g/dL (ref 31.5–35.7)
MCV: 93 fL (ref 79–97)
Monocytes Absolute: 0.5 10*3/uL (ref 0.1–0.9)
Monocytes: 7 %
Neutrophils Absolute: 4 10*3/uL (ref 1.4–7.0)
Neutrophils: 61 %
RBC: 4.51 x10E6/uL (ref 3.77–5.28)
RDW: 13.4 % (ref 11.7–15.4)
WBC: 6.4 10*3/uL (ref 3.4–10.8)

## 2022-09-23 LAB — CMP14+EGFR
ALT: 16 IU/L (ref 0–32)
AST: 17 IU/L (ref 0–40)
Albumin/Globulin Ratio: 1.6 (ref 1.2–2.2)
Albumin: 4.1 g/dL (ref 3.8–4.9)
Alkaline Phosphatase: 74 IU/L (ref 44–121)
BUN/Creatinine Ratio: 15 (ref 9–23)
BUN: 12 mg/dL (ref 6–24)
Bilirubin Total: 0.3 mg/dL (ref 0.0–1.2)
CO2: 24 mmol/L (ref 20–29)
Calcium: 9.5 mg/dL (ref 8.7–10.2)
Chloride: 103 mmol/L (ref 96–106)
Creatinine, Ser: 0.81 mg/dL (ref 0.57–1.00)
Globulin, Total: 2.6 g/dL (ref 1.5–4.5)
Glucose: 79 mg/dL (ref 70–99)
Potassium: 4.5 mmol/L (ref 3.5–5.2)
Sodium: 141 mmol/L (ref 134–144)
Total Protein: 6.7 g/dL (ref 6.0–8.5)
eGFR: 86 mL/min/{1.73_m2} (ref >59)

## 2022-09-23 LAB — LIPID PANEL W/O CHOL/HDL RATIO
Cholesterol, Total: 176 mg/dL (ref 100–199)
HDL: 65 mg/dL (ref >39)
LDL Chol Calc (NIH): 91 mg/dL (ref 0–99)
Triglycerides: 111 mg/dL (ref 0–149)
VLDL Cholesterol Cal: 20 mg/dL (ref 5–40)

## 2022-09-23 LAB — TSH+T4F+T3FREE
Free T4: 1.13 ng/dL (ref 0.82–1.77)
T3, Free: 2.8 pg/mL (ref 2.0–4.4)
TSH: 1.47 u[IU]/mL (ref 0.450–4.500)

## 2022-09-23 LAB — VITAMIN B12: Vitamin B-12: 320 pg/mL (ref 232–1245)

## 2022-09-23 LAB — VITAMIN D 25 HYDROXY (VIT D DEFICIENCY, FRACTURES): Vit D, 25-Hydroxy: 56.7 ng/mL (ref 30.0–100.0)

## 2022-10-06 ENCOUNTER — Encounter: Payer: Self-pay | Admitting: Internal Medicine

## 2022-10-06 ENCOUNTER — Ambulatory Visit: Payer: 59 | Admitting: Internal Medicine

## 2022-10-06 VITALS — BP 125/70 | HR 55 | Ht 65.0 in | Wt 222.2 lb

## 2022-10-06 DIAGNOSIS — Z1231 Encounter for screening mammogram for malignant neoplasm of breast: Secondary | ICD-10-CM

## 2022-10-06 DIAGNOSIS — R7303 Prediabetes: Secondary | ICD-10-CM | POA: Diagnosis not present

## 2022-10-06 DIAGNOSIS — E782 Mixed hyperlipidemia: Secondary | ICD-10-CM

## 2022-10-06 DIAGNOSIS — E669 Obesity, unspecified: Secondary | ICD-10-CM

## 2022-10-06 DIAGNOSIS — R600 Localized edema: Secondary | ICD-10-CM

## 2022-10-06 NOTE — Progress Notes (Signed)
Established Patient Office Visit  Subjective:  Patient ID: Connie Howe, female    DOB: May 01, 1965  Age: 57 y.o. MRN: [id number]  Chief Complaint  Patient presents with   Follow-up    2 week Follow up    Patient in office for 2 week follow up. Patient reports lower extremity edema has improved.  She is taking her Lasix on Monday Wednesdays and Fridays only , advised to start taking it only on as needed basis and try to  keep legs elevated when she can.  Also to wear compression stockings when she can.  Labs all normal. Due for a mammogram, last one 07/2020. Colonoscopy 07/2022.    No other concerns at this time.   Past Medical History:  Diagnosis Date   Allergic rhinitis    Anemia    Asthma    Hyperlipidemia    Obesity    Palpitations    Sleep apnea, obstructive    Vitamin B deficiency    Vitamin D deficiency     Past Surgical History:  Procedure Laterality Date   TUBAL LIGATION     uterine ablation      Social History   Socioeconomic History   Marital status: Married    Spouse name: Not on file   Number of children: Not on file   Years of education: Not on file   Highest education level: Not on file  Occupational History   Not on file  Tobacco Use   Smoking status: Never    Passive exposure: Past   Smokeless tobacco: Never  Substance and Sexual Activity   Alcohol use: Never   Drug use: Never   Sexual activity: Not on file  Other Topics Concern   Not on file  Social History Narrative   Not on file   Social Determinants of Health   Financial Resource Strain: Not on file  Food Insecurity: Not on file  Transportation Needs: Not on file  Physical Activity: Not on file  Stress: Not on file  Social Connections: Not on file  Intimate Partner Violence: Not on file    Family History  Problem Relation Age of Onset   Breast cancer Cousin        maternal    Allergies  Allergen Reactions   Dog Epithelium Dermatitis, Itching, Rash, Shortness  Of Breath and Swelling   Other Shortness Of Breath, Itching and Rash    Cat epithelium,, trees, weed pollen   Grass Pollen(K-O-R-T-Swt Vern) Itching and Rash    Review of Systems  Constitutional: Negative.   HENT: Negative.    Eyes: Negative.   Respiratory: Negative.  Negative for cough, shortness of breath and wheezing.   Cardiovascular:  Positive for leg swelling. Negative for chest pain and palpitations.  Gastrointestinal: Negative.  Negative for abdominal pain, constipation, diarrhea, heartburn, nausea and vomiting.  Genitourinary: Negative.  Negative for dysuria.  Musculoskeletal: Negative.  Negative for falls, joint pain and myalgias.  Skin: Negative.   Neurological: Negative.  Negative for dizziness and headaches.  Endo/Heme/Allergies: Negative.   Psychiatric/Behavioral:  Negative for depression. The patient is nervous/anxious.        Objective:   BP 125/70   Pulse (!) 55   Ht 5\' 5"  (1.651 m)   Wt 222 lb 3.2 oz (100.8 kg)   SpO2 98%   BMI 36.98 kg/m   Vitals:   10/06/22 1036  BP: 125/70  Pulse: (!) 55  Height: 5\' 5"  (1.651 m)  Weight: 222 lb 3.2  oz (100.8 kg)  SpO2: 98%  BMI (Calculated): 36.98    Physical Exam Vitals and nursing note reviewed.  Constitutional:      Appearance: Normal appearance. She is obese.  HENT:     Nose: Nose normal.  Cardiovascular:     Rate and Rhythm: Normal rate and regular rhythm.     Pulses: Normal pulses.     Heart sounds: Normal heart sounds. No murmur heard. Pulmonary:     Effort: Pulmonary effort is normal.     Breath sounds: Normal breath sounds. No wheezing, rhonchi or rales.  Abdominal:     General: Bowel sounds are normal. There is no distension.     Palpations: Abdomen is soft. There is no mass.     Tenderness: There is no guarding or rebound.  Musculoskeletal:        General: Normal range of motion.     Cervical back: Normal range of motion.     Right lower leg: Edema present.     Left lower leg: Edema  present.  Skin:    General: Skin is warm and dry.  Neurological:     General: No focal deficit present.     Mental Status: She is alert and oriented to person, place, and time.  Psychiatric:        Mood and Affect: Mood normal.        Behavior: Behavior normal.      No results found for any visits on 10/06/22.  Recent Results (from the past 2160 hour(s))  CBC With Differential     Status: None   Collection Time: 09/22/22 12:11 PM  Result Value Ref Range   WBC 6.4 3.4 - 10.8 x10E3/uL   RBC 4.51 3.77 - 5.28 x10E6/uL   Hemoglobin 13.8 11.1 - 15.9 g/dL   Hematocrit 16.1 09.6 - 46.6 %   MCV 93 79 - 97 fL   MCH 30.6 26.6 - 33.0 pg   MCHC 33.1 31.5 - 35.7 g/dL   RDW 04.5 40.9 - 81.1 %   Neutrophils 61 Not Estab. %   Lymphs 28 Not Estab. %   Monocytes 7 Not Estab. %   Eos 2 Not Estab. %   Basos 1 Not Estab. %   Neutrophils Absolute 4.0 1.4 - 7.0 x10E3/uL   Lymphocytes Absolute 1.8 0.7 - 3.1 x10E3/uL   Monocytes Absolute 0.5 0.1 - 0.9 x10E3/uL   EOS (ABSOLUTE) 0.1 0.0 - 0.4 x10E3/uL   Basophils Absolute 0.0 0.0 - 0.2 x10E3/uL   Immature Granulocytes 1 Not Estab. %   Immature Grans (Abs) 0.0 0.0 - 0.1 x10E3/uL    Comment: **Effective November 14, 2022, profile [id number] CBC/Differential**   (No Platelet) will be made non-orderable. [facility] Offers:   [id number] CBC With Differential/Platelet   CMP14+EGFR     Status: None   Collection Time: 09/22/22 12:11 PM  Result Value Ref Range   Glucose 79 70 - 99 mg/dL   BUN 12 6 - 24 mg/dL   Creatinine, Ser 9.56 0.57 - 1.00 mg/dL   eGFR 86 >21 HY/QMV/7.84   BUN/Creatinine Ratio 15 9 - 23   Sodium 141 134 - 144 mmol/L   Potassium 4.5 3.5 - 5.2 mmol/L   Chloride 103 96 - 106 mmol/L   CO2 24 20 - 29 mmol/L   Calcium 9.5 8.7 - 10.2 mg/dL   Total Protein 6.7 6.0 - 8.5 g/dL   Albumin 4.1 3.8 - 4.9 g/dL   Globulin, Total 2.6 1.5 - 4.5 g/dL  Albumin/Globulin Ratio 1.6 1.2 - 2.2   Bilirubin Total 0.3 0.0 - 1.2 mg/dL   Alkaline Phosphatase 74 44 -  121 IU/L   AST 17 0 - 40 IU/L   ALT 16 0 - 32 IU/L  Lipid Panel w/o Chol/HDL Ratio     Status: None   Collection Time: 09/22/22 12:11 PM  Result Value Ref Range   Cholesterol, Total 176 100 - 199 mg/dL   Triglycerides 161 0 - 149 mg/dL   HDL 65 >09 mg/dL   VLDL Cholesterol Cal 20 5 - 40 mg/dL   LDL Chol Calc (NIH) 91 0 - 99 mg/dL  Hemoglobin U0A     Status: None   Collection Time: 09/22/22 12:11 PM  Result Value Ref Range   Hgb A1c MFr Bld 5.5 4.8 - 5.6 %    Comment:          Prediabetes: 5.7 - 6.4          Diabetes: >6.4          Glycemic control for adults with diabetes: <7.0    Est. average glucose Bld gHb Est-mCnc 111 mg/dL  [id number]     Status: None   Collection Time: 09/22/22 12:11 PM  Result Value Ref Range   TSH 1.470 0.450 - 4.500 uIU/mL   T3, Free 2.8 2.0 - 4.4 pg/mL   Free T4 1.13 0.82 - 1.77 ng/dL  Vitamin G95     Status: None   Collection Time: 09/22/22 12:11 PM  Result Value Ref Range   Vitamin B-12 320 232 - 1,245 pg/mL  Vitamin D (25 hydroxy)     Status: None   Collection Time: 09/22/22 12:11 PM  Result Value Ref Range   Vit D, 25-Hydroxy 56.7 30.0 - 100.0 ng/mL    Comment: Vitamin D deficiency has been defined by the Institute of Medicine and an Endocrine Society practice guideline as a level of serum 25-OH vitamin D less than 20 ng/mL (1,2). The Endocrine Society went on to further define vitamin D insufficiency as a level between 21 and 29 ng/mL (2). 1. IOM (Institute of Medicine). 2010. Dietary reference    intakes for calcium and D. [place]: The    [employer]. 2. Holick MF, [place], Bischoff-Ferrari HA, et al.    Evaluation, treatment, and prevention of vitamin D    deficiency: an Endocrine Society clinical practice    guideline. JCEM. 2011 Jul; 96(7):1911-30.       Assessment & Plan:  Patient doing well. Continue to elevate legs as much as possible. Decrease Lasix to as needed. Mammogram ordered. Work on diet and  exercise.    Problem List Items Addressed This Visit     Lower leg edema - Primary   Prediabetes   Mixed hyperlipidemia   Obesity (BMI 35.0-39.9 without comorbidity)   Other Visit Diagnoses     Breast cancer screening by mammogram       Relevant Orders   MM 3D SCREENING MAMMOGRAM BILATERAL BREAST       Return in about 3 months (around 01/06/2023).   Total time spent: 25 minutes  Margaretann Loveless, MD  10/06/2022   This document may have been prepared by [facility] Voice Recognition software and as such may include unintentional dictation errors.

## 2022-10-14 ENCOUNTER — Other Ambulatory Visit: Payer: Self-pay | Admitting: Internal Medicine

## 2022-10-14 DIAGNOSIS — R600 Localized edema: Secondary | ICD-10-CM

## 2022-10-21 ENCOUNTER — Other Ambulatory Visit: Payer: Self-pay | Admitting: Nurse Practitioner

## 2022-10-25 ENCOUNTER — Ambulatory Visit: Payer: 59 | Admitting: Gastroenterology

## 2022-12-05 ENCOUNTER — Other Ambulatory Visit: Payer: Self-pay

## 2022-12-05 MED ORDER — BUDESONIDE-FORMOTEROL FUMARATE 160-4.5 MCG/ACT IN AERO: 2.0000 | INHALATION_SPRAY | Freq: Two times a day (BID) | RESPIRATORY_TRACT | 3 refills | Status: DC

## 2022-12-08 ENCOUNTER — Other Ambulatory Visit: Payer: Self-pay

## 2022-12-22 ENCOUNTER — Other Ambulatory Visit: Payer: Self-pay | Admitting: Nurse Practitioner

## 2022-12-26 ENCOUNTER — Ambulatory Visit: Payer: 59 | Admitting: Cardiology

## 2022-12-26 ENCOUNTER — Encounter: Payer: Self-pay | Admitting: Cardiology

## 2022-12-26 VITALS — BP 130/85 | HR 73 | Ht 65.0 in | Wt 222.2 lb

## 2022-12-26 DIAGNOSIS — E538 Deficiency of other specified B group vitamins: Secondary | ICD-10-CM

## 2022-12-26 DIAGNOSIS — R7303 Prediabetes: Secondary | ICD-10-CM | POA: Diagnosis not present

## 2022-12-26 DIAGNOSIS — E782 Mixed hyperlipidemia: Secondary | ICD-10-CM

## 2022-12-26 DIAGNOSIS — E559 Vitamin D deficiency, unspecified: Secondary | ICD-10-CM | POA: Diagnosis not present

## 2022-12-26 DIAGNOSIS — E669 Obesity, unspecified: Secondary | ICD-10-CM

## 2022-12-26 DIAGNOSIS — R600 Localized edema: Secondary | ICD-10-CM | POA: Diagnosis not present

## 2022-12-26 NOTE — Progress Notes (Signed)
Established Patient Office Visit  Subjective:  Patient ID: Connie Howe, female    DOB: 1965/06/27  Age: 57 y.o. MRN: [id number]  Chief Complaint  Patient presents with   Follow-up    3 MO F/U    Patient in office for 3 month follow up. Patient reports feeling well. Edema has resolved. Patient due for blood work, is not fasting today. Will return for blood work.  Patient has not heard from breast center about scheduling mammogram, she will call them.     No other concerns at this time.   Past Medical History:  Diagnosis Date   Allergic rhinitis    Anemia    Asthma    Hyperlipidemia    Obesity    Palpitations    Sleep apnea, obstructive    Vitamin B deficiency    Vitamin D deficiency     Past Surgical History:  Procedure Laterality Date   TUBAL LIGATION     uterine ablation      Social History   Socioeconomic History   Marital status: Married    Spouse name: Not on file   Number of children: Not on file   Years of education: Not on file   Highest education level: Not on file  Occupational History   Not on file  Tobacco Use   Smoking status: Never    Passive exposure: Past   Smokeless tobacco: Never  Substance and Sexual Activity   Alcohol use: Never   Drug use: Never   Sexual activity: Not on file  Other Topics Concern   Not on file  Social History Narrative   Not on file   Social Determinants of Health   Financial Resource Strain: Not on file  Food Insecurity: Not on file  Transportation Needs: Not on file  Physical Activity: Not on file  Stress: Not on file  Social Connections: Not on file  Intimate Partner Violence: Not on file    Family History  Problem Relation Age of Onset   Breast cancer Cousin        maternal    Allergies  Allergen Reactions   Dog Epithelium Dermatitis, Itching, Rash, Shortness Of Breath and Swelling   Other Shortness Of Breath, Itching and Rash    Cat epithelium,, trees, weed pollen   Grass  Pollen(K-O-R-T-Swt Vern) Itching and Rash    Review of Systems  Constitutional: Negative.   HENT: Negative.    Eyes: Negative.   Respiratory: Negative.  Negative for shortness of breath.   Cardiovascular: Negative.  Negative for chest pain.  Gastrointestinal: Negative.  Negative for abdominal pain, constipation and diarrhea.  Genitourinary: Negative.   Musculoskeletal:  Negative for joint pain and myalgias.  Skin: Negative.   Neurological: Negative.  Negative for dizziness and headaches.  Endo/Heme/Allergies: Negative.   All other systems reviewed and are negative.      Objective:   BP 130/85   Pulse 73   Ht 5\' 5"  (1.651 m)   Wt 222 lb 3.2 oz (100.8 kg)   SpO2 98%   BMI 36.98 kg/m   Vitals:   12/26/22 0906  BP: 130/85  Pulse: 73  Height: 5\' 5"  (1.651 m)  Weight: 222 lb 3.2 oz (100.8 kg)  SpO2: 98%  BMI (Calculated): 36.98    Physical Exam Vitals and nursing note reviewed.  Constitutional:      Appearance: Normal appearance. She is normal weight.  HENT:     Head: Normocephalic and atraumatic.  Nose: Nose normal.     Mouth/Throat:     Mouth: Mucous membranes are moist.  Eyes:     Extraocular Movements: Extraocular movements intact.     Conjunctiva/sclera: Conjunctivae normal.     Pupils: Pupils are equal, round, and reactive to light.  Cardiovascular:     Rate and Rhythm: Normal rate and regular rhythm.     Pulses: Normal pulses.     Heart sounds: Normal heart sounds.  Pulmonary:     Effort: Pulmonary effort is normal.     Breath sounds: Normal breath sounds.  Abdominal:     General: Abdomen is flat. Bowel sounds are normal.     Palpations: Abdomen is soft.  Musculoskeletal:        General: Normal range of motion.     Cervical back: Normal range of motion.  Skin:    General: Skin is warm and dry.  Neurological:     General: No focal deficit present.     Mental Status: She is alert and oriented to person, place, and time.  Psychiatric:         Mood and Affect: Mood normal.        Behavior: Behavior normal.        Thought Content: Thought content normal.        Judgment: Judgment normal.      No results found for any visits on 12/26/22.  No results found for this or any previous visit (from the past 2160 hour(s)).    Assessment & Plan:  Elevated legs when experiencing edema. Return for fasting blood work. Schedule mammogram.  Problem List Items Addressed This Visit       Other   Lower leg edema - Primary   Vitamin D deficiency   Vitamin B12 deficiency   Prediabetes   Mixed hyperlipidemia    Return in about 4 months (around 04/27/2023).   Total time spent: 25 minutes  [employer], NP  12/26/2022   This document may have been prepared by Dragon Voice Recognition software and as such may include unintentional dictation errors.

## 2022-12-28 ENCOUNTER — Other Ambulatory Visit: Payer: Self-pay

## 2022-12-29 MED ORDER — BUDESONIDE-FORMOTEROL FUMARATE 160-4.5 MCG/ACT IN AERO: 2.0000 | INHALATION_SPRAY | Freq: Two times a day (BID) | RESPIRATORY_TRACT | 3 refills | Status: DC

## 2022-12-30 ENCOUNTER — Ambulatory Visit
Admission: RE | Admit: 2022-12-30 | Discharge: 2022-12-30 | Disposition: A | Discharge status: Home / Self Care | Payer: 59 | Source: Ambulatory Visit | Attending: Internal Medicine | Admitting: Internal Medicine

## 2022-12-30 DIAGNOSIS — Z1231 Encounter for screening mammogram for malignant neoplasm of breast: Secondary | ICD-10-CM | POA: Diagnosis present

## 2023-01-06 ENCOUNTER — Ambulatory Visit: Payer: 59 | Admitting: Nurse Practitioner

## 2023-01-20 ENCOUNTER — Ambulatory Visit: Payer: 59 | Admitting: Gastroenterology

## 2023-03-04 IMAGING — MG MM DIGITAL SCREENING BILAT W/ TOMO AND CAD
6 of 12 series · 6 of 36 positions shown · non-contrast
Comparison: Previous exam(s).

CLINICAL DATA: Screening.

EXAM:
DIGITAL SCREENING BILATERAL MAMMOGRAM WITH TOMOSYNTHESIS AND CAD
TECHNIQUE: Bilateral screening digital craniocaudal and mediolateral oblique
mammograms were obtained. Bilateral screening digital breast
tomosynthesis was performed. The images were evaluated with
computer-aided detection.

[R CC synth-2D]
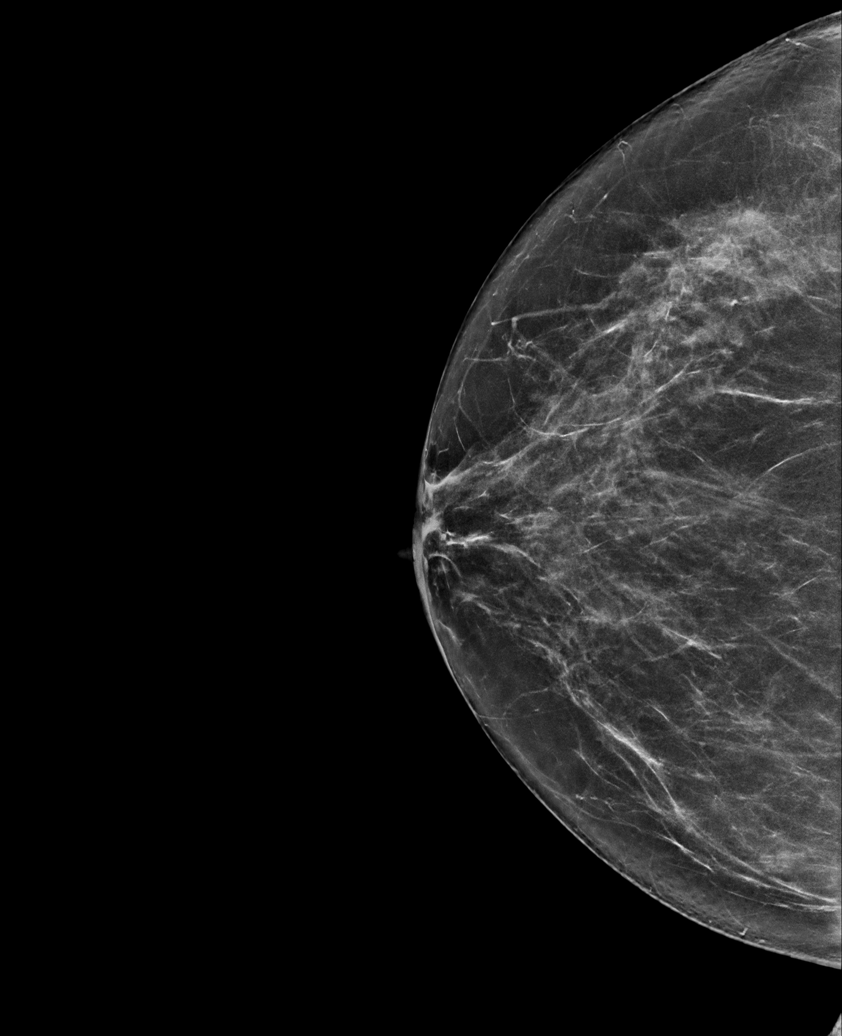

[L CC synth-2D]
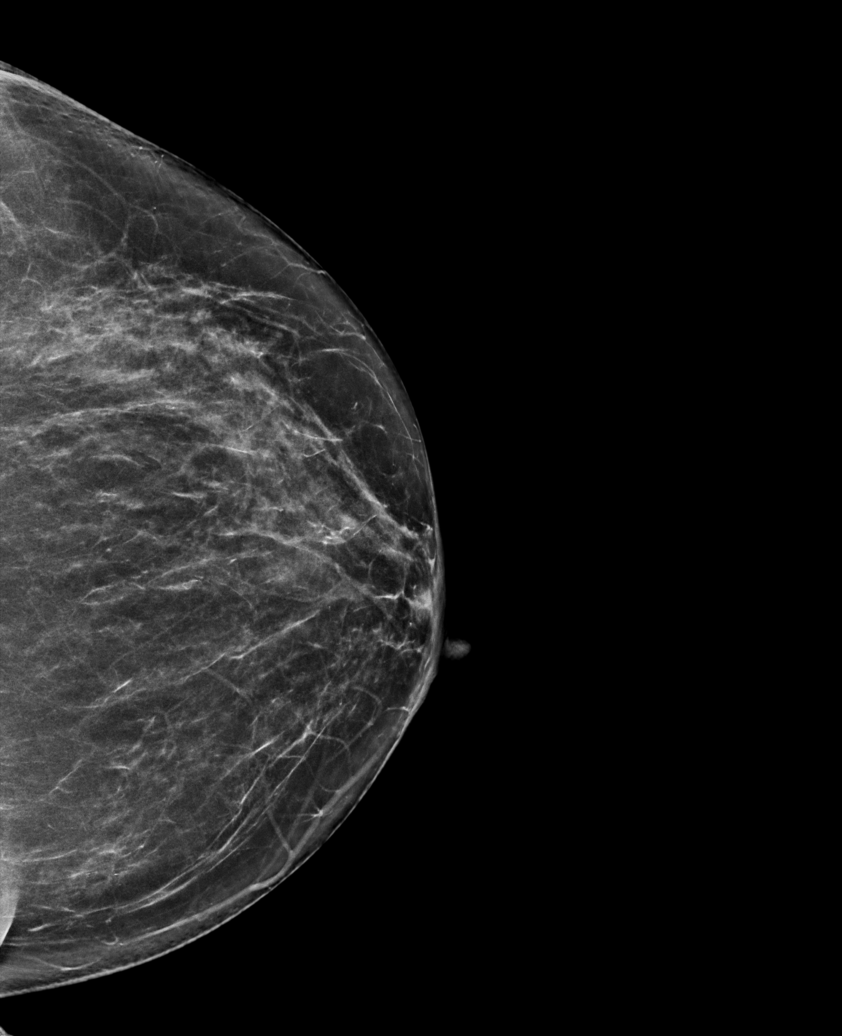

[L MLO synth-2D (1 of 2)]
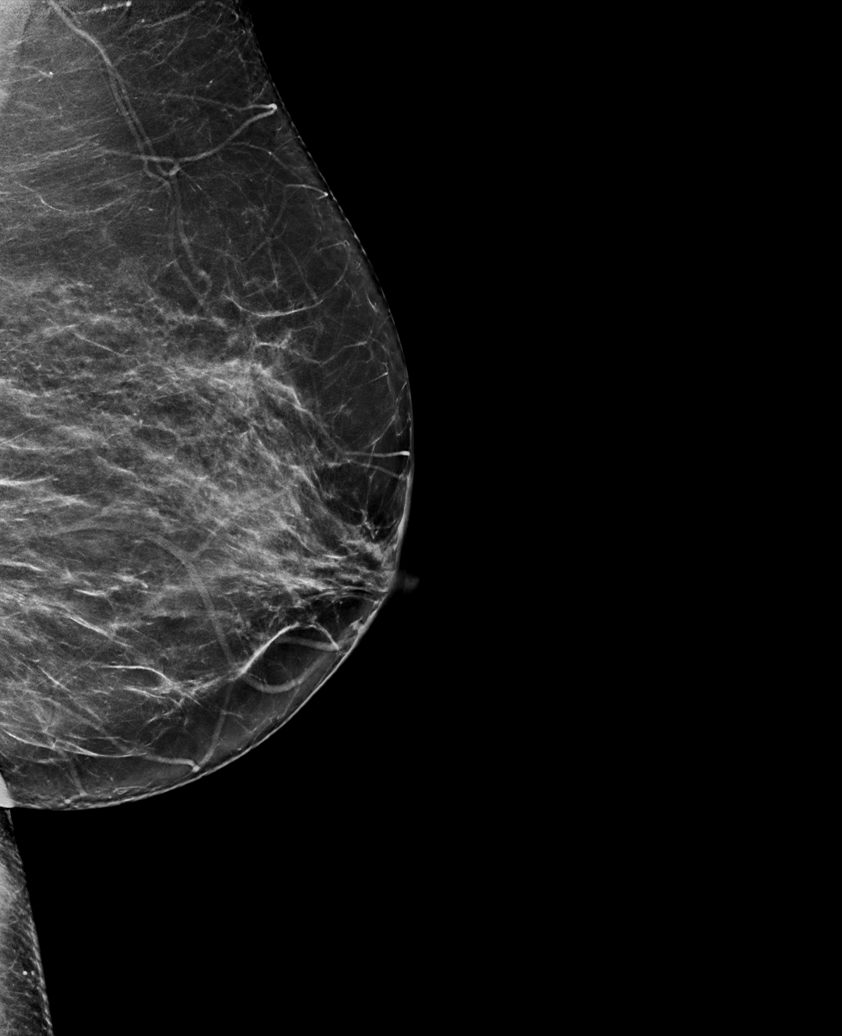

[L MLO synth-2D (2 of 2)]
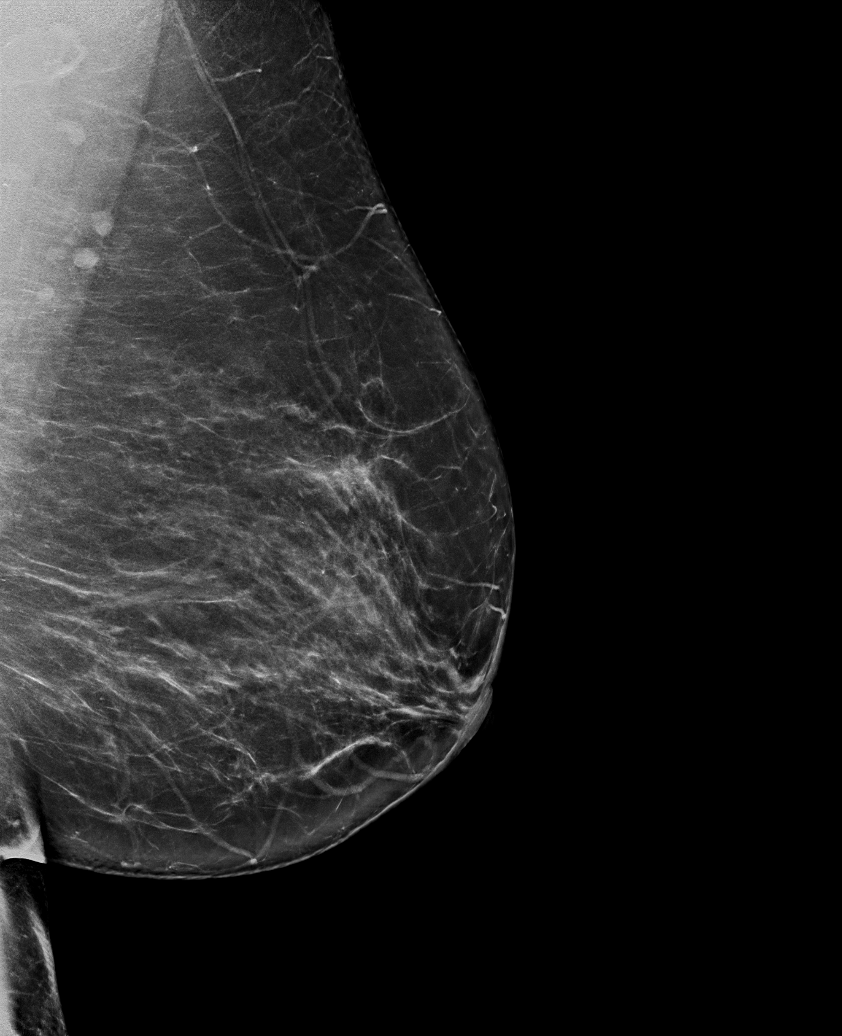

[R MLO synth-2D (1 of 2)]
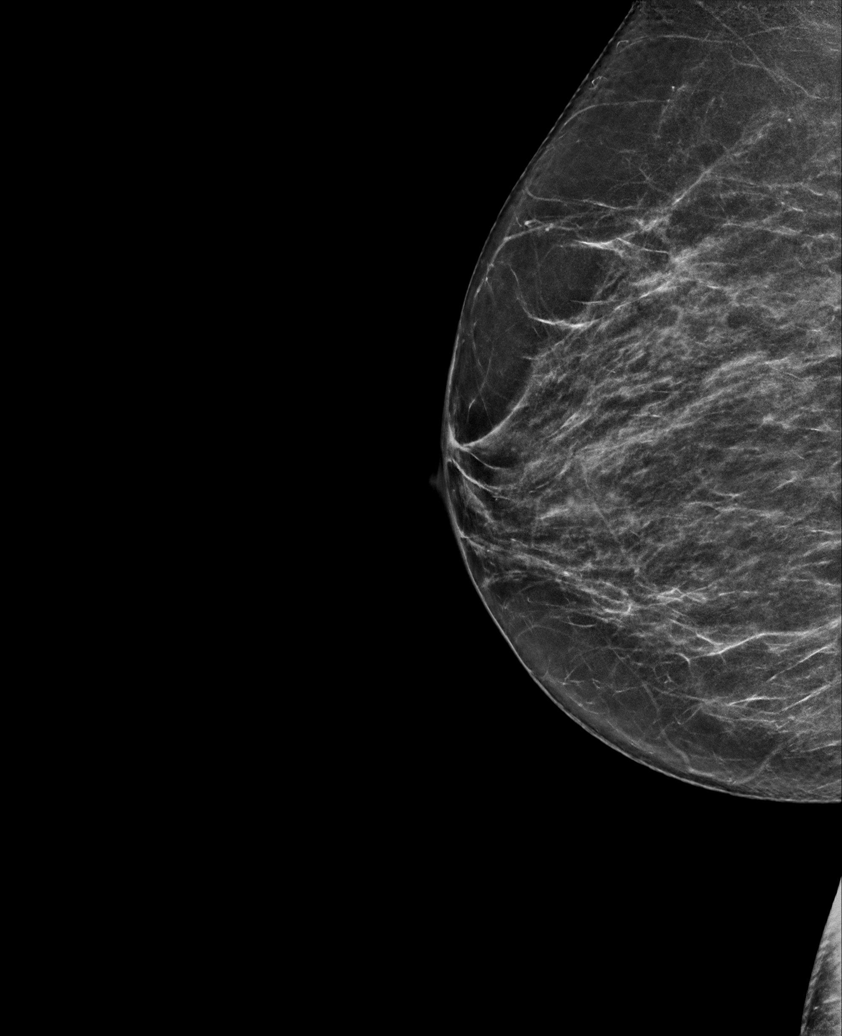

[R MLO synth-2D (2 of 2)]
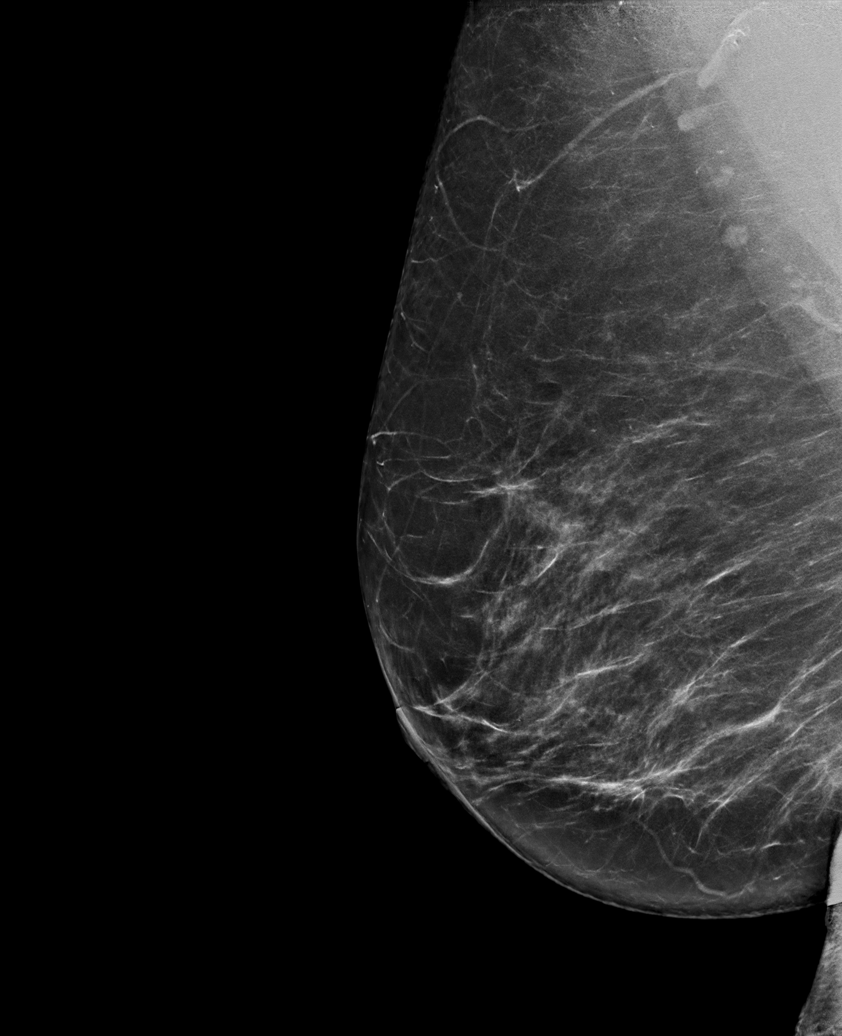

[6 of 36 positions shown; findings below may reference images not displayed]

ACR Breast Density Category b: There are scattered areas of
fibroglandular density.
FINDINGS: There are no findings suspicious for malignancy. The images were
evaluated with computer-aided detection.
IMPRESSION: No mammographic evidence of malignancy. A result letter of this
screening mammogram will be mailed directly to the patient.

RECOMMENDATION:
Screening mammogram in one year. (Code:[id number]-[id number])

BI-RADS CATEGORY  1: Negative.

## 2023-03-27 ENCOUNTER — Other Ambulatory Visit: Payer: Self-pay | Admitting: Cardiology

## 2023-03-29 ENCOUNTER — Ambulatory Visit: Payer: 59 | Admitting: Gastroenterology

## 2023-03-29 ENCOUNTER — Encounter: Payer: Self-pay | Admitting: Gastroenterology

## 2023-03-29 VITALS — BP 140/90 | HR 68 | Ht 64.0 in | Wt 223.0 lb

## 2023-03-29 DIAGNOSIS — Z09 Encounter for follow-up examination after completed treatment for conditions other than malignant neoplasm: Secondary | ICD-10-CM

## 2023-03-29 DIAGNOSIS — Z8619 Personal history of other infectious and parasitic diseases: Secondary | ICD-10-CM | POA: Diagnosis not present

## 2023-03-29 DIAGNOSIS — Z860100 Personal history of colon polyps, unspecified: Secondary | ICD-10-CM

## 2023-03-29 NOTE — Progress Notes (Signed)
   Chief Complaint:    Follow-up after colonoscopy   HPI:     Patient is a 57 y.o. female with a history of allergic rhinitis, asthma, HLD, OSA, obesity, palpitations, vitamin D deficiency, C diff 02/2022, [facility] for routine follow-up.  Was initially seen by Dr. Orvan Falconer on 07/01/2022 for change in bowel habits which started after C. difficile infection (altered stool, mucus-like stool).  Was treated with empiric trial of Xifaxan and referred for colonoscopy.   - 08/04/2022: Colonoscopy: 5 mm flat polyp in transverse colon (path: SSP), Sigmoid diverticulosis, otherwise normal colon with path negative for MC, IBD.  Repeat in 5 years  No GI symptoms or active complaints today. Stools back to baseline.   Reviewed most recent labs from 09/2022: Normal vitamin D, B12, TSH, A1c, CBC, CMP.  No recent abdominal imaging for review.  Review of systems:     No chest pain, no SOB, no fevers, no urinary sx   Past Medical History:  Diagnosis Date   Allergic rhinitis    Anemia    Arthritis    Asthma    Hyperlipidemia    Obesity    Palpitations    Sleep apnea, obstructive    Vitamin B deficiency    Vitamin D deficiency     Patient's surgical history, family medical history, social history, medications and allergies were all reviewed in Epic    Current Outpatient Medications  Medication Sig Dispense Refill   albuterol (VENTOLIN HFA) 108 (90 Base) MCG/ACT inhaler Inhale 2 puffs into the lungs every 4 (four) hours as needed for shortness of breath.     Cholecalciferol (VITAMIN D3) 1.25 MG (50000 UT) CAPS      escitalopram (LEXAPRO) 5 MG tablet TAKE 1 TABLET BY MOUTH DAILY IN  THE MORNING 90 tablet 3   metoprolol succinate (TOPROL-XL) 100 MG 24 hr tablet TAKE 1 TABLET BY MOUTH ONCE  DAILY 90 tablet 3   montelukast (SINGULAIR) 10 MG tablet TAKE 1 TABLET BY MOUTH IN THE  MORNING 90 tablet 3   rosuvastatin (CRESTOR) 5 MG tablet TAKE 1 TABLET BY MOUTH EVERY  NIGHT AT  BEDTIME 90 tablet 3   SYMBICORT 160-4.5 MCG/ACT inhaler USE 2 INHALATIONS BY MOUTH TWICE DAILY 30.6 g 3   No current facility-administered medications for this visit.    Physical Exam:     BP (!) 140/90   Pulse 68   Ht 5\' 4"  (1.626 m)   Wt 223 lb (101.2 kg)   BMI 38.28 kg/m   GENERAL:  Pleasant female in NAD PSYCH: : Cooperative, normal affect Musculoskeletal:  Normal muscle tone, normal strength NEURO: Alert and oriented x 3, no focal neurologic deficits   IMPRESSION and PLAN:    1) History of colon polyps - Repeat colonoscopy in 2029 for ongoing polyp surveillance  2) History of C. difficile C. difficile in 02/2022, treated with vancomycin.  Had some lingering altered stools and was treated with Xifaxan in 06/2022 with resolution.  Has not had any recurrence of symptoms.    RTC prn           Shellia Cleverly ,DO, FACG 03/29/2023, 9:09 AM

## 2023-03-29 NOTE — Patient Instructions (Signed)
_______________________________________________________  If your blood pressure at your visit was 140/90 or greater, please contact your primary care physician to follow up on this.  If you are age 56 or younger, your body mass index should be between 19-25. Your Body mass index is 38.28 kg/m. If this is out of the aformentioned range listed, please consider follow up with your Primary Care Provider.  ________________________________________________________  The [facility] providers would like to encourage you to use [facility] to communicate with providers for non-urgent requests or questions.  Due to long hold times on the telephone, sending your provider a message by [facility] may be a faster and more efficient way to get a response.  Please allow 48 business hours for a response.  Please remember that this is for non-urgent requests.  _______________________________________________________  Bonita Quin will follow up in our office on an as needed basis.   It was a pleasure to see you today!  Vito Cirigliano, D.O.

## 2023-04-27 ENCOUNTER — Ambulatory Visit: Payer: 59 | Admitting: Cardiology

## 2023-05-08 ENCOUNTER — Other Ambulatory Visit: Payer: 59

## 2023-05-08 DIAGNOSIS — E538 Deficiency of other specified B group vitamins: Secondary | ICD-10-CM

## 2023-05-08 DIAGNOSIS — R7303 Prediabetes: Secondary | ICD-10-CM

## 2023-05-08 DIAGNOSIS — E559 Vitamin D deficiency, unspecified: Secondary | ICD-10-CM

## 2023-05-08 DIAGNOSIS — E782 Mixed hyperlipidemia: Secondary | ICD-10-CM

## 2023-05-08 DIAGNOSIS — E669 Obesity, unspecified: Secondary | ICD-10-CM

## 2023-05-08 DIAGNOSIS — R600 Localized edema: Secondary | ICD-10-CM

## 2023-05-09 LAB — CMP14+EGFR
ALT: 18 [IU]/L (ref 0–32)
AST: 16 [IU]/L (ref 0–40)
Albumin: 3.9 g/dL (ref 3.8–4.9)
Alkaline Phosphatase: 84 [IU]/L (ref 44–121)
BUN/Creatinine Ratio: 11 (ref 9–23)
BUN: 10 mg/dL (ref 6–24)
Bilirubin Total: 0.3 mg/dL (ref 0.0–1.2)
CO2: 23 mmol/L (ref 20–29)
Calcium: 9.6 mg/dL (ref 8.7–10.2)
Chloride: 105 mmol/L (ref 96–106)
Creatinine, Ser: 0.95 mg/dL (ref 0.57–1.00)
Globulin, Total: 2.6 g/dL (ref 1.5–4.5)
Glucose: 94 mg/dL (ref 70–99)
Potassium: 4.4 mmol/L (ref 3.5–5.2)
Sodium: 142 mmol/L (ref 134–144)
Total Protein: 6.5 g/dL (ref 6.0–8.5)
eGFR: 70 mL/min/{1.73_m2} (ref >59)

## 2023-05-09 LAB — CBC WITH DIFFERENTIAL/PLATELET
Basophils Absolute: 0.1 10*3/uL (ref 0.0–0.2)
Basos: 1 %
EOS (ABSOLUTE): 0.2 10*3/uL (ref 0.0–0.4)
Eos: 4 %
Hematocrit: 42.9 % (ref 34.0–46.6)
Hemoglobin: 13.5 g/dL (ref 11.1–15.9)
Immature Grans (Abs): 0 10*3/uL (ref 0.0–0.1)
Immature Granulocytes: 0 %
Lymphocytes Absolute: 2 10*3/uL (ref 0.7–3.1)
Lymphs: 30 %
MCH: 29.9 pg (ref 26.6–33.0)
MCHC: 31.5 g/dL (ref 31.5–35.7)
MCV: 95 fL (ref 79–97)
Monocytes Absolute: 0.5 10*3/uL (ref 0.1–0.9)
Monocytes: 8 %
Neutrophils Absolute: 3.9 10*3/uL (ref 1.4–7.0)
Neutrophils: 57 %
Platelets: 190 10*3/uL (ref 150–450)
RBC: 4.52 x10E6/uL (ref 3.77–5.28)
RDW: 12.7 % (ref 11.7–15.4)
WBC: 6.7 10*3/uL (ref 3.4–10.8)

## 2023-05-09 LAB — LIPID PANEL
Chol/HDL Ratio: 2.5 {ratio} (ref 0.0–4.4)
Cholesterol, Total: 169 mg/dL (ref 100–199)
HDL: 67 mg/dL (ref >39)
LDL Chol Calc (NIH): 85 mg/dL (ref 0–99)
Triglycerides: 94 mg/dL (ref 0–149)
VLDL Cholesterol Cal: 17 mg/dL (ref 5–40)

## 2023-05-09 LAB — VITAMIN D 25 HYDROXY (VIT D DEFICIENCY, FRACTURES): Vit D, 25-Hydroxy: 68.9 ng/mL (ref 30.0–100.0)

## 2023-05-09 LAB — HEMOGLOBIN A1C
Est. average glucose Bld gHb Est-mCnc: 117 mg/dL
Hgb A1c MFr Bld: 5.7 % — ABNORMAL HIGH (ref 4.8–5.6)

## 2023-05-09 LAB — VITAMIN B12: Vitamin B-12: 991 pg/mL (ref 232–1245)

## 2023-05-09 LAB — TSH: TSH: 1.56 u[IU]/mL (ref 0.450–4.500)

## 2023-05-11 ENCOUNTER — Ambulatory Visit (INDEPENDENT_AMBULATORY_CARE_PROVIDER_SITE_OTHER): Payer: 59 | Admitting: Cardiology

## 2023-05-11 ENCOUNTER — Encounter: Payer: Self-pay | Admitting: Cardiology

## 2023-05-11 VITALS — BP 152/76 | HR 76 | Ht 64.0 in | Wt 219.0 lb

## 2023-05-11 DIAGNOSIS — F419 Anxiety disorder, unspecified: Secondary | ICD-10-CM | POA: Insufficient documentation

## 2023-05-11 DIAGNOSIS — M179 Osteoarthritis of knee, unspecified: Secondary | ICD-10-CM

## 2023-05-11 DIAGNOSIS — T7840XA Allergy, unspecified, initial encounter: Secondary | ICD-10-CM | POA: Insufficient documentation

## 2023-05-11 DIAGNOSIS — R7303 Prediabetes: Secondary | ICD-10-CM | POA: Diagnosis not present

## 2023-05-11 DIAGNOSIS — E782 Mixed hyperlipidemia: Secondary | ICD-10-CM

## 2023-05-11 DIAGNOSIS — I10 Essential (primary) hypertension: Secondary | ICD-10-CM

## 2023-05-11 DIAGNOSIS — M1990 Unspecified osteoarthritis, unspecified site: Secondary | ICD-10-CM | POA: Insufficient documentation

## 2023-05-11 MED ORDER — ESCITALOPRAM OXALATE 10 MG PO TABS: 10.0000 mg | ORAL_TABLET | Freq: Every day | ORAL | 2 refills | Status: DC

## 2023-05-11 NOTE — Progress Notes (Signed)
Established Patient Office Visit  Subjective:  Patient ID: Connie Howe, female    DOB: 05-07-1965  Age: 58 y.o. MRN: [id number]  Chief Complaint  Patient presents with   Follow-up    4 Months Follow Up    Patient in office for 4 month follow up, discuss recent lab results. Patient has multiple complaints today.  Saw [facility] for left knee pain, diagnosed with osteoarthritis, given meloxicam. Keep follow up appointment with them.  Patient reports increase in anxiety since returning to in person work. Will increase Lexapro to 10 mg daily.  Patient reports since returning to work in August, she has had an increase in allergy symptoms, taking Singulair and Claritin as needed with no relief. Will order labs today, allergy panel.  Discussed recent lab work. LDL at goal. Hgb A1c elevated, prediabetic. All other labs WNL.     No other concerns at this time.   Past Medical History:  Diagnosis Date   Allergic rhinitis    Anemia    Arthritis    Asthma    Hyperlipidemia    Obesity    Palpitations    Sleep apnea, obstructive    Vitamin B deficiency    Vitamin D deficiency     Past Surgical History:  Procedure Laterality Date   TUBAL LIGATION     uterine ablation      Social History   Socioeconomic History   Marital status: Married    Spouse name: Not on file   Number of children: Not on file   Years of education: Not on file   Highest education level: Not on file  Occupational History   Not on file  Tobacco Use   Smoking status: Never    Passive exposure: Past   Smokeless tobacco: Never  Substance and Sexual Activity   Alcohol use: Never   Drug use: Never   Sexual activity: Not on file  Other Topics Concern   Not on file  Social History Narrative   Not on file   Social Drivers of Health   Financial Resource Strain: Not on file  Food Insecurity: Not on file  Transportation Needs: Not on file  Physical Activity: Not on file  Stress: Not on file   Social Connections: Not on file  Intimate Partner Violence: Not on file    Family History  Problem Relation Age of Onset   Breast cancer Cousin        maternal    Allergies  Allergen Reactions   Dog Epithelium Dermatitis, Itching, Rash, Shortness Of Breath and Swelling   Other Shortness Of Breath, Itching and Rash    Cat epithelium,, trees, weed pollen   Grass Pollen(K-O-R-T-Swt Vern) Itching and Rash    Outpatient Medications Prior to Visit  Medication Sig   albuterol (VENTOLIN HFA) 108 (90 Base) MCG/ACT inhaler Inhale 2 puffs into the lungs every 4 (four) hours as needed for shortness of breath.   Cholecalciferol (VITAMIN D3) 1.25 MG (50000 UT) CAPS    cyanocobalamin (VITAMIN B12) 500 MCG tablet Take 500 mcg by mouth daily.   Lactobacillus (PROBIOTIC ACIDOPHILUS) CAPS Take by mouth.   metoprolol succinate (TOPROL-XL) 100 MG 24 hr tablet TAKE 1 TABLET BY MOUTH ONCE  DAILY   montelukast (SINGULAIR) 10 MG tablet TAKE 1 TABLET BY MOUTH IN THE  MORNING   rosuvastatin (CRESTOR) 5 MG tablet TAKE 1 TABLET BY MOUTH EVERY  NIGHT AT BEDTIME   SYMBICORT 160-4.5 MCG/ACT inhaler USE 2 INHALATIONS BY MOUTH  TWICE DAILY   [DISCONTINUED] escitalopram (LEXAPRO) 5 MG tablet TAKE 1 TABLET BY MOUTH DAILY IN  THE MORNING   meloxicam (MOBIC) 7.5 MG tablet Take 7.5 mg by mouth daily as needed for pain.   No facility-administered medications prior to visit.    Review of Systems  Constitutional: Negative.   HENT: Negative.         Frequent rhinorrhea   Eyes: Negative.   Respiratory: Negative.  Negative for shortness of breath.   Cardiovascular: Negative.  Negative for chest pain.  Gastrointestinal: Negative.  Negative for abdominal pain, constipation and diarrhea.  Genitourinary: Negative.   Musculoskeletal:  Negative for joint pain and myalgias.  Skin: Negative.   Neurological:  Positive for headaches. Negative for dizziness.  Endo/Heme/Allergies: Negative.   Psychiatric/Behavioral:  The  patient is nervous/anxious and has insomnia.   All other systems reviewed and are negative.      Objective:   BP (!) 152/76   Pulse 76   Ht 5\' 4"  (1.626 m)   Wt 219 lb (99.3 kg)   SpO2 94%   BMI 37.59 kg/m   Vitals:   05/11/23 0903  BP: (!) 152/76  Pulse: 76  Height: 5\' 4"  (1.626 m)  Weight: 219 lb (99.3 kg)  SpO2: 94%  BMI (Calculated): 37.57    Physical Exam Vitals and nursing note reviewed.  Constitutional:      Appearance: Normal appearance. She is normal weight.  HENT:     Head: Normocephalic and atraumatic.     Nose: Nose normal.     Mouth/Throat:     Mouth: Mucous membranes are moist.  Eyes:     Extraocular Movements: Extraocular movements intact.     Conjunctiva/sclera: Conjunctivae normal.     Pupils: Pupils are equal, round, and reactive to light.  Cardiovascular:     Rate and Rhythm: Normal rate and regular rhythm.     Pulses: Normal pulses.     Heart sounds: Normal heart sounds.  Pulmonary:     Effort: Pulmonary effort is normal.     Breath sounds: Normal breath sounds.  Abdominal:     General: Abdomen is flat. Bowel sounds are normal.     Palpations: Abdomen is soft.  Musculoskeletal:        General: Normal range of motion.     Cervical back: Normal range of motion.  Skin:    General: Skin is warm and dry.  Neurological:     General: No focal deficit present.     Mental Status: She is alert and oriented to person, place, and time.  Psychiatric:        Mood and Affect: Mood normal.        Behavior: Behavior normal.        Thought Content: Thought content normal.        Judgment: Judgment normal.      No results found for any visits on 05/11/23.  Recent Results (from the past 2160 hours)  CBC with Differential/Platelet     Status: None   Collection Time: 05/08/23  8:52 AM  Result Value Ref Range   WBC 6.7 3.4 - 10.8 x10E3/uL   RBC 4.52 3.77 - 5.28 x10E6/uL   Hemoglobin 13.5 11.1 - 15.9 g/dL   Hematocrit 56.2 13.0 - 46.6 %   MCV 95  79 - 97 fL   MCH 29.9 26.6 - 33.0 pg   MCHC 31.5 31.5 - 35.7 g/dL   RDW 86.5 78.4 - 69.6 %  Platelets 190 150 - 450 x10E3/uL   Neutrophils 57 Not Estab. %   Lymphs 30 Not Estab. %   Monocytes 8 Not Estab. %   Eos 4 Not Estab. %   Basos 1 Not Estab. %   Neutrophils Absolute 3.9 1.4 - 7.0 x10E3/uL   Lymphocytes Absolute 2.0 0.7 - 3.1 x10E3/uL   Monocytes Absolute 0.5 0.1 - 0.9 x10E3/uL   EOS (ABSOLUTE) 0.2 0.0 - 0.4 x10E3/uL   Basophils Absolute 0.1 0.0 - 0.2 x10E3/uL   Immature Granulocytes 0 Not Estab. %   Immature Grans (Abs) 0.0 0.0 - 0.1 x10E3/uL  CMP14+EGFR     Status: None   Collection Time: 05/08/23  8:52 AM  Result Value Ref Range   Glucose 94 70 - 99 mg/dL   BUN 10 6 - 24 mg/dL   Creatinine, Ser 1.61 0.57 - 1.00 mg/dL   eGFR 70 >09 UE/AVW/0.98   BUN/Creatinine Ratio 11 9 - 23   Sodium 142 134 - 144 mmol/L   Potassium 4.4 3.5 - 5.2 mmol/L   Chloride 105 96 - 106 mmol/L   CO2 23 20 - 29 mmol/L   Calcium 9.6 8.7 - 10.2 mg/dL   Total Protein 6.5 6.0 - 8.5 g/dL   Albumin 3.9 3.8 - 4.9 g/dL   Globulin, Total 2.6 1.5 - 4.5 g/dL   Bilirubin Total 0.3 0.0 - 1.2 mg/dL   Alkaline Phosphatase 84 44 - 121 IU/L   AST 16 0 - 40 IU/L   ALT 18 0 - 32 IU/L  Lipid panel     Status: None   Collection Time: 05/08/23  8:52 AM  Result Value Ref Range   Cholesterol, Total 169 100 - 199 mg/dL   Triglycerides 94 0 - 149 mg/dL   HDL 67 >11 mg/dL   VLDL Cholesterol Cal 17 5 - 40 mg/dL   LDL Chol Calc (NIH) 85 0 - 99 mg/dL   Chol/HDL Ratio 2.5 0.0 - 4.4 ratio    Comment:                                   T. Chol/HDL Ratio                                             Men  Women                               1/2 Avg.Risk  3.4    3.3                                   Avg.Risk  5.0    4.4                                2X Avg.Risk  9.6    7.1                                3X Avg.Risk 23.4   11.0   Hemoglobin A1c     Status: Abnormal   Collection Time: 05/08/23  8:52 AM  Result Value  Ref Range   Hgb A1c MFr Bld 5.7 (H) 4.8 - 5.6 %    Comment:          Prediabetes: 5.7 - 6.4          Diabetes: >6.4          Glycemic control for adults with diabetes: <7.0    Est. average glucose Bld gHb Est-mCnc 117 mg/dL  TSH     Status: None   Collection Time: 05/08/23  8:52 AM  Result Value Ref Range   TSH 1.560 0.450 - 4.500 uIU/mL  Vitamin B12     Status: None   Collection Time: 05/08/23  8:52 AM  Result Value Ref Range   Vitamin B-12 991 232 - 1,245 pg/mL  Vitamin D (25 hydroxy)     Status: None   Collection Time: 05/08/23  8:52 AM  Result Value Ref Range   Vit D, 25-Hydroxy 68.9 30.0 - 100.0 ng/mL    Comment: Vitamin D deficiency has been defined by the Institute of Medicine and an Endocrine Society practice guideline as a level of serum 25-OH vitamin D less than 20 ng/mL (1,2). The Endocrine Society went on to further define vitamin D insufficiency as a level between 21 and 29 ng/mL (2). 1. IOM (Institute of Medicine). 2010. Dietary reference    intakes for calcium and D. [place]: The    [employer]. 2. Holick MF, Binkley [place], Bischoff-Ferrari HA, et al.    Evaluation, treatment, and prevention of vitamin D    deficiency: an Endocrine Society clinical practice    guideline. JCEM. 2011 Jul; 96(7):1911-30.       Assessment & Plan:  Follow up with ortho as needed Increase Lexapro to 10 mg daily Allergy lab work today, will call with results  Problem List Items Addressed This Visit       Musculoskeletal and Integument   Osteoarthritis   Relevant Medications   meloxicam (MOBIC) 7.5 MG tablet     Other   Prediabetes   Mixed hyperlipidemia - Primary   Allergies   Relevant Orders   Allergen Profile, Mold   Anxiety   Relevant Medications   escitalopram (LEXAPRO) 10 MG tablet    Return in about 4 months (around 09/08/2023) for with fasting labs prior.   Total time spent: 25 minutes  Google, NP  05/11/2023   This document may  have been prepared by Dragon Voice Recognition software and as such may include unintentional dictation errors.

## 2023-05-13 LAB — ALLERGEN PROFILE, MOLD
Alternaria Alternata IgE: <0.1 kU/L
Aspergillus Fumigatus IgE: <0.1 kU/L
Aureobasidi Pullulans IgE: <0.1 kU/L
Candida Albicans IgE: <0.1 kU/L
Cladosporium Herbarum IgE: <0.1 kU/L
M009-IgE Fusarium proliferatum: <0.1 kU/L
M014-IgE Epicoccum purpur: <0.1 kU/L
Mucor Racemosus IgE: <0.1 kU/L
Penicillium Chrysogen IgE: <0.1 kU/L
Phoma Betae IgE: <0.1 kU/L
Setomelanomma Rostrat: <0.1 kU/L
Stemphylium Herbarum IgE: <0.1 kU/L

## 2023-05-17 NOTE — Progress Notes (Signed)
Patient notified

## 2023-06-08 ENCOUNTER — Other Ambulatory Visit: Payer: Self-pay | Admitting: Cardiology

## 2023-07-17 ENCOUNTER — Other Ambulatory Visit: Payer: Self-pay

## 2023-07-17 MED ORDER — MONTELUKAST SODIUM 10 MG PO TABS: 10.0000 mg | ORAL_TABLET | Freq: Every morning | ORAL | 3 refills | Status: DC

## 2023-07-19 ENCOUNTER — Other Ambulatory Visit: Payer: Self-pay

## 2023-07-19 MED ORDER — MONTELUKAST SODIUM 10 MG PO TABS: 10.0000 mg | ORAL_TABLET | Freq: Every morning | ORAL | 3 refills | Status: AC

## 2023-08-14 ENCOUNTER — Other Ambulatory Visit: Payer: Self-pay

## 2023-08-14 MED ORDER — ESCITALOPRAM OXALATE 10 MG PO TABS: 10.0000 mg | ORAL_TABLET | Freq: Every day | ORAL | 1 refills | Status: DC

## 2023-09-04 ENCOUNTER — Other Ambulatory Visit

## 2023-09-04 ENCOUNTER — Other Ambulatory Visit: Payer: Self-pay

## 2023-09-04 DIAGNOSIS — E559 Vitamin D deficiency, unspecified: Secondary | ICD-10-CM

## 2023-09-04 DIAGNOSIS — E669 Obesity, unspecified: Secondary | ICD-10-CM

## 2023-09-04 DIAGNOSIS — R7303 Prediabetes: Secondary | ICD-10-CM

## 2023-09-04 DIAGNOSIS — E538 Deficiency of other specified B group vitamins: Secondary | ICD-10-CM

## 2023-09-04 DIAGNOSIS — I10 Essential (primary) hypertension: Secondary | ICD-10-CM

## 2023-09-04 DIAGNOSIS — E782 Mixed hyperlipidemia: Secondary | ICD-10-CM

## 2023-09-05 ENCOUNTER — Ambulatory Visit: Payer: Self-pay | Admitting: Cardiology

## 2023-09-05 LAB — CMP14+EGFR
ALT: 13 IU/L (ref 0–32)
AST: 13 IU/L (ref 0–40)
Albumin: 3.7 g/dL — ABNORMAL LOW (ref 3.8–4.9)
Alkaline Phosphatase: 83 IU/L (ref 44–121)
BUN/Creatinine Ratio: 14 (ref 9–23)
BUN: 13 mg/dL (ref 6–24)
Bilirubin Total: 0.2 mg/dL (ref 0.0–1.2)
CO2: 24 mmol/L (ref 20–29)
Calcium: 9.4 mg/dL (ref 8.7–10.2)
Chloride: 105 mmol/L (ref 96–106)
Creatinine, Ser: 0.94 mg/dL (ref 0.57–1.00)
Globulin, Total: 2.6 g/dL (ref 1.5–4.5)
Glucose: 87 mg/dL (ref 70–99)
Potassium: 4 mmol/L (ref 3.5–5.2)
Sodium: 142 mmol/L (ref 134–144)
Total Protein: 6.3 g/dL (ref 6.0–8.5)
eGFR: 71 mL/min/{1.73_m2} (ref >59)

## 2023-09-05 LAB — VITAMIN B12: Vitamin B-12: 902 pg/mL (ref 232–1245)

## 2023-09-05 LAB — LIPID PANEL
Chol/HDL Ratio: 2.6 ratio (ref 0.0–4.4)
Cholesterol, Total: 162 mg/dL (ref 100–199)
HDL: 62 mg/dL (ref >39)
LDL Chol Calc (NIH): 80 mg/dL (ref 0–99)
Triglycerides: 112 mg/dL (ref 0–149)
VLDL Cholesterol Cal: 20 mg/dL (ref 5–40)

## 2023-09-05 LAB — TSH: TSH: 1.99 u[IU]/mL (ref 0.450–4.500)

## 2023-09-05 LAB — HEMOGLOBIN A1C
Est. average glucose Bld gHb Est-mCnc: 114 mg/dL
Hgb A1c MFr Bld: 5.6 % (ref 4.8–5.6)

## 2023-09-05 LAB — VITAMIN D 25 HYDROXY (VIT D DEFICIENCY, FRACTURES): Vit D, 25-Hydroxy: 64.8 ng/mL (ref 30.0–100.0)

## 2023-09-08 ENCOUNTER — Encounter: Payer: Self-pay | Admitting: Cardiology

## 2023-09-08 ENCOUNTER — Ambulatory Visit: Payer: 59 | Admitting: Cardiology

## 2023-09-08 VITALS — BP 136/72 | HR 64 | Ht 64.0 in | Wt 225.8 lb

## 2023-09-08 DIAGNOSIS — E782 Mixed hyperlipidemia: Secondary | ICD-10-CM

## 2023-09-08 DIAGNOSIS — Z0130 Encounter for examination of blood pressure without abnormal findings: Secondary | ICD-10-CM

## 2023-09-08 DIAGNOSIS — R7303 Prediabetes: Secondary | ICD-10-CM

## 2023-09-08 DIAGNOSIS — R600 Localized edema: Secondary | ICD-10-CM

## 2023-09-08 MED ORDER — WEGOVY 0.25 MG/0.5ML ~~LOC~~ SOAJ: 0.2500 mg | SUBCUTANEOUS | 3 refills | Status: DC

## 2023-09-08 MED ORDER — CHLORHEXIDINE GLUCONATE 4 % EX SOLN: Freq: Every day | CUTANEOUS | 0 refills | Status: DC | PRN

## 2023-09-08 NOTE — Progress Notes (Unsigned)
 Established Patient Office Visit  Subjective:  Patient ID: Connie Howe, female    DOB: 19-Oct-1965  Age: 58 y.o. MRN: [id number]  Chief Complaint  Patient presents with   Follow-up    4 month follow up    Patient in office for 4 month follow up, discuss recent lab results. Patient doing well. Complains of bilateral lower extremity edema. Will order an echocardiogram to check heart function.  Discuss recent lab work. LDL at goal. Hgb A1c normal, no longer pre diabetic. TSH normal. Normal kidney function.  Patient wanting to discuss weight loss options, wants to try a GLP1 injectable. Will send in [facility].  Continue all other medications.     No other concerns at this time.   Past Medical History:  Diagnosis Date   Allergic rhinitis    Anemia    Arthritis    Asthma    Hyperlipidemia    Obesity    Palpitations    Sleep apnea, obstructive    Vitamin B deficiency    Vitamin D  deficiency     Past Surgical History:  Procedure Laterality Date   TUBAL LIGATION     uterine ablation      Social History   Socioeconomic History   Marital status: Married    Spouse name: Not on file   Number of children: Not on file   Years of education: Not on file   Highest education level: Not on file  Occupational History   Not on file  Tobacco Use   Smoking status: Never    Passive exposure: Past   Smokeless tobacco: Never  Substance and Sexual Activity   Alcohol use: Never   Drug use: Never   Sexual activity: Not on file  Other Topics Concern   Not on file  Social History Narrative   Not on file   Social Drivers of Health   Financial Resource Strain: Not on file  Food Insecurity: Not on file  Transportation Needs: Not on file  Physical Activity: Not on file  Stress: Not on file  Social Connections: Not on file  Intimate Partner Violence: Not on file    Family History  Problem Relation Age of Onset   Breast cancer Cousin        maternal    Allergies   Allergen Reactions   Dog Epithelium Dermatitis, Itching, Rash, Shortness Of Breath and Swelling   Other Shortness Of Breath, Itching and Rash    Cat epithelium,, trees, weed pollen   Grass Pollen(K-O-R-T-Swt Vern) Itching and Rash    Outpatient Medications Prior to Visit  Medication Sig   albuterol (VENTOLIN HFA) 108 (90 Base) MCG/ACT inhaler Inhale 2 puffs into the lungs every 4 (four) hours as needed for shortness of breath.   Cholecalciferol (VITAMIN D3) 1.25 MG (50000 UT) CAPS    cyanocobalamin (VITAMIN B12) 500 MCG tablet Take 500 mcg by mouth daily.   escitalopram  (LEXAPRO ) 10 MG tablet Take 1 tablet (10 mg total) by mouth daily.   Lactobacillus (PROBIOTIC ACIDOPHILUS) CAPS Take by mouth.   meloxicam (MOBIC) 7.5 MG tablet Take 7.5 mg by mouth daily as needed for pain.   metoprolol succinate (TOPROL-XL) 100 MG 24 hr tablet TAKE 1 TABLET BY MOUTH ONCE  DAILY   montelukast  (SINGULAIR ) 10 MG tablet Take 1 tablet (10 mg total) by mouth every morning.   rosuvastatin (CRESTOR) 5 MG tablet TAKE 1 TABLET BY MOUTH EVERY  NIGHT AT BEDTIME   SYMBICORT  160-4.5 MCG/ACT inhaler USE 2  INHALATIONS BY MOUTH TWICE DAILY   No facility-administered medications prior to visit.    Review of Systems  Constitutional: Negative.   HENT: Negative.    Eyes: Negative.   Respiratory: Negative.  Negative for shortness of breath.   Cardiovascular: Negative.  Negative for chest pain.  Gastrointestinal: Negative.  Negative for abdominal pain, constipation and diarrhea.  Genitourinary: Negative.   Musculoskeletal:  Negative for joint pain and myalgias.  Skin: Negative.   Neurological: Negative.  Negative for dizziness and headaches.  Endo/Heme/Allergies: Negative.   All other systems reviewed and are negative.      Objective:   BP 136/72   Pulse 64   Ht 5\' 4"  (1.626 m)   Wt 225 lb 12.8 oz (102.4 kg)   SpO2 98%   BMI 38.76 kg/m   Vitals:   09/08/23 1458  BP: 136/72  Pulse: 64  Height: 5\' 4"   (1.626 m)  Weight: 225 lb 12.8 oz (102.4 kg)  SpO2: 98%  BMI (Calculated): 38.74    Physical Exam Vitals and nursing note reviewed.  Constitutional:      Appearance: Normal appearance. She is normal weight.  HENT:     Head: Normocephalic and atraumatic.     Nose: Nose normal.     Mouth/Throat:     Mouth: Mucous membranes are moist.  Eyes:     Extraocular Movements: Extraocular movements intact.     Conjunctiva/sclera: Conjunctivae normal.     Pupils: Pupils are equal, round, and reactive to light.  Cardiovascular:     Rate and Rhythm: Normal rate and regular rhythm.     Pulses: Normal pulses.     Heart sounds: Normal heart sounds.  Pulmonary:     Effort: Pulmonary effort is normal.     Breath sounds: Normal breath sounds.  Abdominal:     General: Abdomen is flat. Bowel sounds are normal.     Palpations: Abdomen is soft.  Musculoskeletal:        General: Normal range of motion.     Cervical back: Normal range of motion.  Skin:    General: Skin is warm and dry.  Neurological:     General: No focal deficit present.     Mental Status: She is alert and oriented to person, place, and time.  Psychiatric:        Mood and Affect: Mood normal.        Behavior: Behavior normal.        Thought Content: Thought content normal.        Judgment: Judgment normal.      No results found for any visits on 09/08/23.  Recent Results (from the past 2160 hours)  Hemoglobin A1c     Status: None   Collection Time: 09/04/23  8:58 AM  Result Value Ref Range   Hgb A1c MFr Bld 5.6 4.8 - 5.6 %    Comment:          Prediabetes: 5.7 - 6.4          Diabetes: >6.4          Glycemic control for adults with diabetes: <7.0    Est. average glucose Bld gHb Est-mCnc 114 mg/dL  TSH     Status: None   Collection Time: 09/04/23  8:58 AM  Result Value Ref Range   TSH 1.990 0.450 - 4.500 uIU/mL  CMP14+EGFR     Status: Abnormal   Collection Time: 09/04/23  8:58 AM  Result Value Ref Range  Glucose  87 70 - 99 mg/dL   BUN 13 6 - 24 mg/dL   Creatinine, Ser 1.61 0.57 - 1.00 mg/dL   eGFR 71 >09 UE/AVW/0.98   BUN/Creatinine Ratio 14 9 - 23   Sodium 142 134 - 144 mmol/L   Potassium 4.0 3.5 - 5.2 mmol/L   Chloride 105 96 - 106 mmol/L   CO2 24 20 - 29 mmol/L   Calcium 9.4 8.7 - 10.2 mg/dL   Total Protein 6.3 6.0 - 8.5 g/dL   Albumin 3.7 (L) 3.8 - 4.9 g/dL   Globulin, Total 2.6 1.5 - 4.5 g/dL   Bilirubin Total 0.2 0.0 - 1.2 mg/dL   Alkaline Phosphatase 83 44 - 121 IU/L   AST 13 0 - 40 IU/L   ALT 13 0 - 32 IU/L  Lipid panel     Status: None   Collection Time: 09/04/23  8:58 AM  Result Value Ref Range   Cholesterol, Total 162 100 - 199 mg/dL   Triglycerides 119 0 - 149 mg/dL   HDL 62 >14 mg/dL   VLDL Cholesterol Cal 20 5 - 40 mg/dL   LDL Chol Calc (NIH) 80 0 - 99 mg/dL   Chol/HDL Ratio 2.6 0.0 - 4.4 ratio    Comment:                                   T. Chol/HDL Ratio                                             Men  Women                               1/2 Avg.Risk  3.4    3.3                                   Avg.Risk  5.0    4.4                                2X Avg.Risk  9.6    7.1                                3X Avg.Risk 23.4   11.0   Vitamin B12     Status: None   Collection Time: 09/04/23  8:58 AM  Result Value Ref Range   Vitamin B-12 902 232 - 1,245 pg/mL  Vitamin D  (25 hydroxy)     Status: None   Collection Time: 09/04/23  8:58 AM  Result Value Ref Range   Vit D, 25-Hydroxy 64.8 30.0 - 100.0 ng/mL    Comment: Vitamin D  deficiency has been defined by the Institute of Medicine and an Endocrine Society practice guideline as a level of serum 25-OH vitamin D  less than 20 ng/mL (1,2). The Endocrine Society went on to further define vitamin D  insufficiency as a level between 21 and 29 ng/mL (2). 1. IOM (Institute of Medicine). 2010. Dietary reference    intakes for calcium and D. [place]: The    [employer]. 2. Holick MF, Binkley [place], Bischoff-Ferrari  HA, et al.    Evaluation, treatment, and prevention of vitamin D     deficiency: an Endocrine Society clinical practice    guideline. JCEM. 2011 Jul; 96(7):1911-30.       Assessment & Plan:  Echocardiogram to check heart function. Wegovy for weight loss. Continue all other medications.   Problem List Items Addressed This Visit       Other   Mixed hyperlipidemia - Primary   Other Visit Diagnoses       Lower extremity edema       Relevant Orders   PCV ECHOCARDIOGRAM COMPLETE       Return in about 6 weeks (around 10/20/2023).   Total time spent: 25 minutes  Google, NP  09/08/2023   This document may have been prepared by Dragon Voice Recognition software and as such may include unintentional dictation errors.

## 2023-09-12 ENCOUNTER — Encounter: Payer: Self-pay | Admitting: Cardiology

## 2023-09-21 ENCOUNTER — Encounter: Payer: Self-pay | Admitting: Cardiology

## 2023-09-22 ENCOUNTER — Ambulatory Visit

## 2023-09-22 DIAGNOSIS — I340 Nonrheumatic mitral (valve) insufficiency: Secondary | ICD-10-CM

## 2023-09-22 DIAGNOSIS — I361 Nonrheumatic tricuspid (valve) insufficiency: Secondary | ICD-10-CM | POA: Diagnosis not present

## 2023-09-22 DIAGNOSIS — I371 Nonrheumatic pulmonary valve insufficiency: Secondary | ICD-10-CM | POA: Diagnosis not present

## 2023-09-22 DIAGNOSIS — R600 Localized edema: Secondary | ICD-10-CM

## 2023-09-27 ENCOUNTER — Ambulatory Visit: Payer: Self-pay | Admitting: Cardiology

## 2023-10-13 ENCOUNTER — Encounter: Payer: Self-pay | Admitting: Cardiology

## 2023-10-13 ENCOUNTER — Ambulatory Visit (INDEPENDENT_AMBULATORY_CARE_PROVIDER_SITE_OTHER): Admitting: Cardiology

## 2023-10-13 ENCOUNTER — Ambulatory Visit: Admitting: Cardiology

## 2023-10-13 VITALS — BP 115/82 | HR 72 | Ht 64.0 in | Wt 218.6 lb

## 2023-10-13 DIAGNOSIS — Z0130 Encounter for examination of blood pressure without abnormal findings: Secondary | ICD-10-CM

## 2023-10-13 DIAGNOSIS — Z713 Dietary counseling and surveillance: Secondary | ICD-10-CM | POA: Diagnosis not present

## 2023-10-13 DIAGNOSIS — E669 Obesity, unspecified: Secondary | ICD-10-CM | POA: Diagnosis not present

## 2023-10-13 DIAGNOSIS — I340 Nonrheumatic mitral (valve) insufficiency: Secondary | ICD-10-CM | POA: Diagnosis not present

## 2023-10-13 MED ORDER — WEGOVY 0.5 MG/0.5ML ~~LOC~~ SOAJ: 0.5000 mg | SUBCUTANEOUS | 4 refills | Status: DC

## 2023-10-13 NOTE — Progress Notes (Signed)
 Established Patient Office Visit  Subjective:  Patient ID: Connie Howe, female    DOB: 18-Feb-1966  Age: 58 y.o. MRN: [id number]  Chief Complaint  Patient presents with   Follow-up    Patient in office for one month follow up. Patient doing well Complains of right foot pain for the past month. Patient will call [facility]. Discussed recent echo results, normal EF.  Moderate MVR.  Patient taking and tolerating Wegovy , will increase to 5 mg weekly.     No other concerns at this time.   Past Medical History:  Diagnosis Date   Allergic rhinitis    Anemia    Arthritis    Asthma    Hyperlipidemia    Obesity    Palpitations    Sleep apnea, obstructive    Vitamin B deficiency    Vitamin D  deficiency     Past Surgical History:  Procedure Laterality Date   TUBAL LIGATION     uterine ablation      Social History   Socioeconomic History   Marital status: Married    Spouse name: Not on file   Number of children: Not on file   Years of education: Not on file   Highest education level: Not on file  Occupational History   Not on file  Tobacco Use   Smoking status: Never    Passive exposure: Past   Smokeless tobacco: Never  Substance and Sexual Activity   Alcohol use: Never   Drug use: Never   Sexual activity: Not on file  Other Topics Concern   Not on file  Social History Narrative   Not on file   Social Drivers of Health   Financial Resource Strain: Not on file  Food Insecurity: Not on file  Transportation Needs: Not on file  Physical Activity: Not on file  Stress: Not on file  Social Connections: Not on file  Intimate Partner Violence: Not on file    Family History  Problem Relation Age of Onset   Breast cancer Cousin        maternal    Allergies  Allergen Reactions   Dog Epithelium Dermatitis, Itching, Rash, Shortness Of Breath and Swelling   Other Shortness Of Breath, Itching and Rash    Cat epithelium,, trees, weed pollen   Grass  Pollen(K-O-R-T-Swt Vern) Itching and Rash    Outpatient Medications Prior to Visit  Medication Sig   albuterol (VENTOLIN HFA) 108 (90 Base) MCG/ACT inhaler Inhale 2 puffs into the lungs every 4 (four) hours as needed for shortness of breath.   Cholecalciferol (VITAMIN D3) 1.25 MG (50000 UT) CAPS    cyanocobalamin (VITAMIN B12) 500 MCG tablet Take 500 mcg by mouth daily.   escitalopram  (LEXAPRO ) 10 MG tablet Take 1 tablet (10 mg total) by mouth daily.   Lactobacillus (PROBIOTIC ACIDOPHILUS) CAPS Take by mouth.   meloxicam (MOBIC) 7.5 MG tablet Take 7.5 mg by mouth daily as needed for pain.   metoprolol succinate (TOPROL-XL) 100 MG 24 hr tablet TAKE 1 TABLET BY MOUTH ONCE  DAILY   montelukast  (SINGULAIR ) 10 MG tablet Take 1 tablet (10 mg total) by mouth every morning.   rosuvastatin (CRESTOR) 5 MG tablet TAKE 1 TABLET BY MOUTH EVERY  NIGHT AT BEDTIME   SYMBICORT  160-4.5 MCG/ACT inhaler USE 2 INHALATIONS BY MOUTH TWICE DAILY   [DISCONTINUED] Semaglutide -Weight Management (WEGOVY ) 0.25 MG/0.5ML SOAJ Inject 0.25 mg into the skin once a week.   No facility-administered medications prior to visit.  Review of Systems  Constitutional: Negative.   HENT: Negative.    Eyes: Negative.   Respiratory: Negative.  Negative for shortness of breath.   Cardiovascular: Negative.  Negative for chest pain.  Gastrointestinal: Negative.  Negative for abdominal pain, constipation and diarrhea.  Genitourinary: Negative.   Musculoskeletal:  Negative for myalgias.       Right foot pain  Skin: Negative.   Neurological: Negative.  Negative for dizziness and headaches.  Endo/Heme/Allergies: Negative.   All other systems reviewed and are negative.      Objective:   BP 115/82   Pulse 72   Ht 5' 4 (1.626 m)   Wt 218 lb 9.6 oz (99.2 kg)   SpO2 96%   BMI 37.52 kg/m   Vitals:   10/13/23 1315  BP: 115/82  Pulse: 72  Height: 5' 4 (1.626 m)  Weight: 218 lb 9.6 oz (99.2 kg)  SpO2: 96%  BMI  (Calculated): 37.5    Physical Exam Vitals and nursing note reviewed.  Constitutional:      Appearance: Normal appearance. She is normal weight.  HENT:     Head: Normocephalic and atraumatic.     Nose: Nose normal.     Mouth/Throat:     Mouth: Mucous membranes are moist.   Eyes:     Extraocular Movements: Extraocular movements intact.     Conjunctiva/sclera: Conjunctivae normal.     Pupils: Pupils are equal, round, and reactive to light.    Cardiovascular:     Rate and Rhythm: Normal rate and regular rhythm.     Pulses: Normal pulses.     Heart sounds: Normal heart sounds.  Pulmonary:     Effort: Pulmonary effort is normal.     Breath sounds: Normal breath sounds.  Abdominal:     General: Abdomen is flat. Bowel sounds are normal.     Palpations: Abdomen is soft.   Musculoskeletal:        General: Normal range of motion.     Cervical back: Normal range of motion.   Skin:    General: Skin is warm and dry.   Neurological:     General: No focal deficit present.     Mental Status: She is alert and oriented to person, place, and time.   Psychiatric:        Mood and Affect: Mood normal.        Behavior: Behavior normal.        Thought Content: Thought content normal.        Judgment: Judgment normal.      No results found for any visits on 10/13/23.  Recent Results (from the past 2160 hours)  Hemoglobin A1c     Status: None   Collection Time: 09/04/23  8:58 AM  Result Value Ref Range   Hgb A1c MFr Bld 5.6 4.8 - 5.6 %    Comment:          Prediabetes: 5.7 - 6.4          Diabetes: >6.4          Glycemic control for adults with diabetes: <7.0    Est. average glucose Bld gHb Est-mCnc 114 mg/dL  TSH     Status: None   Collection Time: 09/04/23  8:58 AM  Result Value Ref Range   TSH 1.990 0.450 - 4.500 uIU/mL  CMP14+EGFR     Status: Abnormal   Collection Time: 09/04/23  8:58 AM  Result Value Ref Range   Glucose 87 70 -  99 mg/dL   BUN 13 6 - 24 mg/dL    Creatinine, Ser 9.05 0.57 - 1.00 mg/dL   eGFR 71 >40 fO/fpw/8.26   BUN/Creatinine Ratio 14 9 - 23   Sodium 142 134 - 144 mmol/L   Potassium 4.0 3.5 - 5.2 mmol/L   Chloride 105 96 - 106 mmol/L   CO2 24 20 - 29 mmol/L   Calcium 9.4 8.7 - 10.2 mg/dL   Total Protein 6.3 6.0 - 8.5 g/dL   Albumin 3.7 (L) 3.8 - 4.9 g/dL   Globulin, Total 2.6 1.5 - 4.5 g/dL   Bilirubin Total 0.2 0.0 - 1.2 mg/dL   Alkaline Phosphatase 83 44 - 121 IU/L   AST 13 0 - 40 IU/L   ALT 13 0 - 32 IU/L  Lipid panel     Status: None   Collection Time: 09/04/23  8:58 AM  Result Value Ref Range   Cholesterol, Total 162 100 - 199 mg/dL   Triglycerides 887 0 - 149 mg/dL   HDL 62 >60 mg/dL   VLDL Cholesterol Cal 20 5 - 40 mg/dL   LDL Chol Calc (NIH) 80 0 - 99 mg/dL   Chol/HDL Ratio 2.6 0.0 - 4.4 ratio    Comment:                                   T. Chol/HDL Ratio                                             Men  Women                               1/2 Avg.Risk  3.4    3.3                                   Avg.Risk  5.0    4.4                                2X Avg.Risk  9.6    7.1                                3X Avg.Risk 23.4   11.0   Vitamin B12     Status: None   Collection Time: 09/04/23  8:58 AM  Result Value Ref Range   Vitamin B-12 902 232 - 1,245 pg/mL  Vitamin D  (25 hydroxy)     Status: None   Collection Time: 09/04/23  8:58 AM  Result Value Ref Range   Vit D, 25-Hydroxy 64.8 30.0 - 100.0 ng/mL    Comment: Vitamin D  deficiency has been defined by the Institute of Medicine and an Endocrine Society practice guideline as a level of serum 25-OH vitamin D  less than 20 ng/mL (1,2). The Endocrine Society went on to further define vitamin D  insufficiency as a level between 21 and 29 ng/mL (2). 1. IOM (Institute of Medicine). 2010. Dietary reference    intakes for calcium and D. [place]: The    [employer]. 2. Holick  MF, Binkley [place], Bischoff-Ferrari HA, et al.    Evaluation, treatment, and  prevention of vitamin D     deficiency: an Endocrine Society clinical practice    guideline. JCEM. 2011 Jul; 96(7):1911-30.       Assessment & Plan:  Call [facility] Increase Wegovy  to 5 mg weekly  Problem List Items Addressed This Visit       Cardiovascular and Mediastinum   Nonrheumatic mitral valve regurgitation     Other   Obesity (BMI 35.0-39.9 without comorbidity) - Primary   Relevant Medications   Semaglutide -Weight Management (WEGOVY ) 0.5 MG/0.5ML SOAJ   Weight loss counseling, encounter for    Return in about 3 months (around 01/13/2024) for fasting labs prior.   Total time spent: 25 minutes  Google, NP  10/13/2023   This document may have been prepared by Dragon Voice Recognition software and as such may include unintentional dictation errors.

## 2023-11-07 ENCOUNTER — Encounter: Payer: Self-pay | Admitting: Cardiology

## 2023-11-07 ENCOUNTER — Other Ambulatory Visit: Payer: Self-pay

## 2023-11-07 MED ORDER — ROSUVASTATIN CALCIUM 5 MG PO TABS: 5.0000 mg | ORAL_TABLET | Freq: Every day | ORAL | 3 refills | Status: AC

## 2023-11-24 ENCOUNTER — Other Ambulatory Visit: Payer: Self-pay | Admitting: Family

## 2023-12-28 ENCOUNTER — Encounter: Payer: Self-pay | Admitting: Cardiology

## 2023-12-28 ENCOUNTER — Other Ambulatory Visit: Payer: Self-pay | Admitting: Cardiology

## 2023-12-28 MED ORDER — WEGOVY 1 MG/0.5ML ~~LOC~~ SOAJ: 1.0000 mg | SUBCUTANEOUS | 4 refills | Status: AC

## 2024-01-05 ENCOUNTER — Other Ambulatory Visit

## 2024-01-05 DIAGNOSIS — I10 Essential (primary) hypertension: Secondary | ICD-10-CM

## 2024-01-05 DIAGNOSIS — E538 Deficiency of other specified B group vitamins: Secondary | ICD-10-CM

## 2024-01-05 DIAGNOSIS — E669 Obesity, unspecified: Secondary | ICD-10-CM

## 2024-01-05 DIAGNOSIS — E782 Mixed hyperlipidemia: Secondary | ICD-10-CM

## 2024-01-05 DIAGNOSIS — R7303 Prediabetes: Secondary | ICD-10-CM

## 2024-01-05 DIAGNOSIS — E559 Vitamin D deficiency, unspecified: Secondary | ICD-10-CM

## 2024-01-06 LAB — LIPID PANEL
Chol/HDL Ratio: 2.7 ratio (ref 0.0–4.4)
Cholesterol, Total: 147 mg/dL (ref 100–199)
HDL: 55 mg/dL (ref >39)
LDL Chol Calc (NIH): 78 mg/dL (ref 0–99)
Triglycerides: 73 mg/dL (ref 0–149)
VLDL Cholesterol Cal: 14 mg/dL (ref 5–40)

## 2024-01-06 LAB — CMP14+EGFR
ALT: 15 IU/L (ref 0–32)
AST: 17 IU/L (ref 0–40)
Albumin: 3.8 g/dL (ref 3.8–4.9)
Alkaline Phosphatase: 68 IU/L (ref 49–135)
BUN/Creatinine Ratio: 14 (ref 9–23)
BUN: 13 mg/dL (ref 6–24)
Bilirubin Total: 0.3 mg/dL (ref 0.0–1.2)
CO2: 22 mmol/L (ref 20–29)
Calcium: 9.4 mg/dL (ref 8.7–10.2)
Chloride: 105 mmol/L (ref 96–106)
Creatinine, Ser: 0.95 mg/dL (ref 0.57–1.00)
Globulin, Total: 2.5 g/dL (ref 1.5–4.5)
Glucose: 87 mg/dL (ref 70–99)
Potassium: 4.1 mmol/L (ref 3.5–5.2)
Sodium: 141 mmol/L (ref 134–144)
Total Protein: 6.3 g/dL (ref 6.0–8.5)
eGFR: 70 mL/min/1.73 (ref >59)

## 2024-01-06 LAB — HEMOGLOBIN A1C
Est. average glucose Bld gHb Est-mCnc: 108 mg/dL
Hgb A1c MFr Bld: 5.4 % (ref 4.8–5.6)

## 2024-01-06 LAB — VITAMIN D 25 HYDROXY (VIT D DEFICIENCY, FRACTURES): Vit D, 25-Hydroxy: 74.8 ng/mL (ref 30.0–100.0)

## 2024-01-06 LAB — TSH: TSH: 1.46 u[IU]/mL (ref 0.450–4.500)

## 2024-01-06 LAB — VITAMIN B12: Vitamin B-12: 1221 pg/mL (ref 232–1245)

## 2024-01-08 ENCOUNTER — Ambulatory Visit: Payer: Self-pay | Admitting: Cardiology

## 2024-01-12 ENCOUNTER — Ambulatory Visit: Admitting: Cardiology

## 2024-01-16 ENCOUNTER — Encounter: Payer: Self-pay | Admitting: Cardiology

## 2024-01-16 ENCOUNTER — Ambulatory Visit (INDEPENDENT_AMBULATORY_CARE_PROVIDER_SITE_OTHER): Admitting: Cardiology

## 2024-01-16 VITALS — BP 123/87 | HR 81 | Ht 64.0 in | Wt 203.2 lb

## 2024-01-16 DIAGNOSIS — E782 Mixed hyperlipidemia: Secondary | ICD-10-CM

## 2024-01-16 DIAGNOSIS — E669 Obesity, unspecified: Secondary | ICD-10-CM

## 2024-01-16 DIAGNOSIS — Z713 Dietary counseling and surveillance: Secondary | ICD-10-CM

## 2024-01-16 NOTE — Progress Notes (Signed)
 Established Patient Office Visit  Subjective:  Patient ID: Connie Howe, female    DOB: [birth year]  Age: 58 y.o. MRN: [id number]  Chief Complaint  Patient presents with   Follow-up    3 month follow up - lab results    Patient in office for 3 month follow up, discuss recent lab results. Patient doing well, no complaints today.  Patient taking and tolerating Wegovy  1 mg weekly, losing weight.  Discussed recent lab work, no longer pre diabetic. LDL improved.  Continue same medications.     No other concerns at this time.   Past Medical History:  Diagnosis Date   Allergic rhinitis    Anemia    Arthritis    Asthma    Hyperlipidemia    Obesity    Palpitations    Sleep apnea, obstructive    Vitamin B deficiency    Vitamin D  deficiency     Past Surgical History:  Procedure Laterality Date   TUBAL LIGATION     uterine ablation      Social History   Socioeconomic History   Marital status: Married    Spouse name: Not on file   Number of children: Not on file   Years of education: Not on file   Highest education level: Not on file  Occupational History   Not on file  Tobacco Use   Smoking status: Never    Passive exposure: Past   Smokeless tobacco: Never  Substance and Sexual Activity   Alcohol use: Never   Drug use: Never   Sexual activity: Not on file  Other Topics Concern   Not on file  Social History Narrative   Not on file   Social Drivers of Health   Financial Resource Strain: Not on file  Food Insecurity: Not on file  Transportation Needs: Not on file  Physical Activity: Not on file  Stress: Not on file  Social Connections: Not on file  Intimate Partner Violence: Not on file    Family History  Problem Relation Age of Onset   Breast cancer Cousin        maternal    Allergies  Allergen Reactions   Dog Epithelium Dermatitis, Itching, Rash, Shortness Of Breath and Swelling   Other Shortness Of Breath, Itching and Rash    Cat  epithelium,, trees, weed pollen   Grass Pollen(K-O-R-T-Swt Vern) Itching and Rash    Outpatient Medications Prior to Visit  Medication Sig   albuterol (VENTOLIN HFA) 108 (90 Base) MCG/ACT inhaler Inhale 2 puffs into the lungs every 4 (four) hours as needed for shortness of breath.   Cholecalciferol (VITAMIN D3) 1.25 MG (50000 UT) CAPS    cyanocobalamin (VITAMIN B12) 500 MCG tablet Take 500 mcg by mouth daily.   escitalopram  (LEXAPRO ) 10 MG tablet Take 1 tablet (10 mg total) by mouth daily.   Lactobacillus (PROBIOTIC ACIDOPHILUS) CAPS Take by mouth.   meloxicam (MOBIC) 7.5 MG tablet Take 7.5 mg by mouth daily as needed for pain.   metoprolol succinate (TOPROL-XL) 100 MG 24 hr tablet TAKE 1 TABLET BY MOUTH ONCE  DAILY   montelukast  (SINGULAIR ) 10 MG tablet Take 1 tablet (10 mg total) by mouth every morning.   rosuvastatin  (CRESTOR ) 5 MG tablet Take 1 tablet (5 mg total) by mouth at bedtime.   semaglutide -weight management (WEGOVY ) 1 MG/0.5ML SOAJ SQ injection Inject 1 mg into the skin once a week.   SYMBICORT  160-4.5 MCG/ACT inhaler USE 2 INHALATIONS BY MOUTH TWICE DAILY  No facility-administered medications prior to visit.    Review of Systems  Constitutional: Negative.   HENT: Negative.    Eyes: Negative.   Respiratory: Negative.  Negative for shortness of breath.   Cardiovascular: Negative.  Negative for chest pain.  Gastrointestinal: Negative.  Negative for abdominal pain, constipation and diarrhea.  Genitourinary: Negative.   Musculoskeletal:  Negative for joint pain and myalgias.  Skin: Negative.   Neurological: Negative.  Negative for dizziness and headaches.  Endo/Heme/Allergies: Negative.   All other systems reviewed and are negative.      Objective:   BP 123/87   Pulse 81   Ht 5' 4 (1.626 m)   Wt 203 lb 3.2 oz (92.2 kg)   SpO2 98%   BMI 34.88 kg/m   Vitals:   01/16/24 0942  BP: 123/87  Pulse: 81  Height: 5' 4 (1.626 m)  Weight: 203 lb 3.2 oz (92.2 kg)   SpO2: 98%  BMI (Calculated): 34.86    Physical Exam Vitals and nursing note reviewed.  Constitutional:      Appearance: Normal appearance. She is normal weight.  HENT:     Head: Normocephalic and atraumatic.     Nose: Nose normal.     Mouth/Throat:     Mouth: Mucous membranes are moist.  Eyes:     Extraocular Movements: Extraocular movements intact.     Conjunctiva/sclera: Conjunctivae normal.     Pupils: Pupils are equal, round, and reactive to light.  Cardiovascular:     Rate and Rhythm: Normal rate and regular rhythm.     Pulses: Normal pulses.     Heart sounds: Normal heart sounds.  Pulmonary:     Effort: Pulmonary effort is normal.     Breath sounds: Normal breath sounds.  Abdominal:     General: Abdomen is flat. Bowel sounds are normal.     Palpations: Abdomen is soft.  Musculoskeletal:        General: Normal range of motion.     Cervical back: Normal range of motion.  Skin:    General: Skin is warm and dry.  Neurological:     General: No focal deficit present.     Mental Status: She is alert and oriented to person, place, and time.  Psychiatric:        Mood and Affect: Mood normal.        Behavior: Behavior normal.        Thought Content: Thought content normal.        Judgment: Judgment normal.      No results found for any visits on 01/16/24.  Recent Results (from the past 2160 hours)  Vitamin D  (25 hydroxy)     Status: None   Collection Time: 01/05/24  9:09 AM  Result Value Ref Range   Vit D, 25-Hydroxy 74.8 30.0 - 100.0 ng/mL    Comment: Vitamin D  deficiency has been defined by the Institute of Medicine and an Endocrine Society practice guideline as a level of serum 25-OH vitamin D  less than 20 ng/mL (1,2). The Endocrine Society went on to further define vitamin D  insufficiency as a level between 21 and 29 ng/mL (2). 1. IOM (Institute of Medicine). 2010. Dietary reference    intakes for calcium  and D. [place]: The    [employer]. 2. Holick MF, Binkley [place], Bischoff-Ferrari HA, et al.    Evaluation, treatment, and prevention of vitamin D     deficiency: an Endocrine Society clinical practice    guideline. JCEM.  2011 Jul; 96(7):1911-30.   Vitamin B12     Status: None   Collection Time: 01/05/24  9:09 AM  Result Value Ref Range   Vitamin B-12 1,221 232 - 1,245 pg/mL  Lipid panel     Status: None   Collection Time: 01/05/24  9:09 AM  Result Value Ref Range   Cholesterol, Total 147 100 - 199 mg/dL   Triglycerides 73 0 - 149 mg/dL   HDL 55 >60 mg/dL   VLDL Cholesterol Cal 14 5 - 40 mg/dL   LDL Chol Calc (NIH) 78 0 - 99 mg/dL   Chol/HDL Ratio 2.7 0.0 - 4.4 ratio    Comment:                                   T. Chol/HDL Ratio                                             Men  Women                               1/2 Avg.Risk  3.4    3.3                                   Avg.Risk  5.0    4.4                                2X Avg.Risk  9.6    7.1                                3X Avg.Risk 23.4   11.0   CMP14+EGFR     Status: None   Collection Time: 01/05/24  9:09 AM  Result Value Ref Range   Glucose 87 70 - 99 mg/dL   BUN 13 6 - 24 mg/dL   Creatinine, Ser 9.04 0.57 - 1.00 mg/dL   eGFR 70 >40 fO/fpw/8.26   BUN/Creatinine Ratio 14 9 - 23   Sodium 141 134 - 144 mmol/L   Potassium 4.1 3.5 - 5.2 mmol/L   Chloride 105 96 - 106 mmol/L   CO2 22 20 - 29 mmol/L   Calcium  9.4 8.7 - 10.2 mg/dL   Total Protein 6.3 6.0 - 8.5 g/dL   Albumin 3.8 3.8 - 4.9 g/dL   Globulin, Total 2.5 1.5 - 4.5 g/dL   Bilirubin Total 0.3 0.0 - 1.2 mg/dL   Alkaline Phosphatase 68 49 - 135 IU/L    Comment:               **Please note reference interval change**   AST 17 0 - 40 IU/L   ALT 15 0 - 32 IU/L  TSH     Status: None   Collection Time: 01/05/24  9:09 AM  Result Value Ref Range   TSH 1.460 0.450 - 4.500 uIU/mL  Hemoglobin A1c     Status: None   Collection Time: 01/05/24  9:09 AM  Result Value Ref Range   Hgb A1c MFr Bld 5.4 4.8  - 5.6 %  Comment:          Prediabetes: 5.7 - 6.4          Diabetes: >6.4          Glycemic control for adults with diabetes: <7.0    Est. average glucose Bld gHb Est-mCnc 108 mg/dL      Assessment & Plan:  Continue same medications.   Problem List Items Addressed This Visit       Other   Mixed hyperlipidemia - Primary   Obesity (BMI 35.0-39.9 without comorbidity)   Weight loss counseling, encounter for    Return in about 6 months (around 07/15/2024) for fasting labs prior.   Total time spent: 25 minutes  [employer], NP  01/16/2024   This document may have been prepared by Dragon Voice Recognition software and as such may include unintentional dictation errors.

## 2024-02-13 ENCOUNTER — Other Ambulatory Visit: Payer: Self-pay | Admitting: Cardiology

## 2024-07-16 ENCOUNTER — Ambulatory Visit: Admitting: Cardiology
